# Patient Record
Sex: Male | Born: 1940
Health system: Southern US, Community
[De-identification: ages and names within clinical notes are randomized; demographics above are authoritative.]

## PROBLEM LIST (undated history)

## (undated) DIAGNOSIS — E669 Obesity, unspecified: Secondary | ICD-10-CM

## (undated) DIAGNOSIS — I4891 Unspecified atrial fibrillation: Secondary | ICD-10-CM

## (undated) DIAGNOSIS — G4730 Sleep apnea, unspecified: Secondary | ICD-10-CM

## (undated) DIAGNOSIS — M1990 Unspecified osteoarthritis, unspecified site: Secondary | ICD-10-CM

## (undated) DIAGNOSIS — I872 Venous insufficiency (chronic) (peripheral): Secondary | ICD-10-CM

## (undated) DIAGNOSIS — E785 Hyperlipidemia, unspecified: Secondary | ICD-10-CM

## (undated) DIAGNOSIS — N529 Male erectile dysfunction, unspecified: Secondary | ICD-10-CM

## (undated) DIAGNOSIS — D126 Benign neoplasm of colon, unspecified: Secondary | ICD-10-CM

## (undated) DIAGNOSIS — I499 Cardiac arrhythmia, unspecified: Secondary | ICD-10-CM

## (undated) DIAGNOSIS — E119 Type 2 diabetes mellitus without complications: Secondary | ICD-10-CM

## (undated) DIAGNOSIS — I10 Essential (primary) hypertension: Secondary | ICD-10-CM

## (undated) HISTORY — PX: VARICOSE VEIN SURGERY: SHX832

## (undated) HISTORY — DX: Sleep apnea, unspecified: G47.30

## (undated) HISTORY — DX: Unspecified atrial fibrillation: I48.91

## (undated) HISTORY — PX: HERNIA REPAIR: SHX51

## (undated) HISTORY — DX: Obesity, unspecified: E66.9

## (undated) HISTORY — DX: Hyperlipidemia, unspecified: E78.5

## (undated) HISTORY — DX: Venous insufficiency (chronic) (peripheral): I87.2

## (undated) HISTORY — DX: Male erectile dysfunction, unspecified: N52.9

## (undated) HISTORY — DX: Benign neoplasm of colon, unspecified: D12.6

## (undated) HISTORY — DX: Type 2 diabetes mellitus without complications: E11.9

---

## 1998-06-06 ENCOUNTER — Ambulatory Visit (HOSPITAL_COMMUNITY): Admission: AD | Admit: 1998-06-06 | Discharge: 1998-06-06 | Payer: Self-pay | Admitting: *Deleted

## 2003-11-24 ENCOUNTER — Encounter: Admission: RE | Admit: 2003-11-24 | Discharge: 2003-11-24 | Payer: Self-pay | Admitting: Internal Medicine

## 2009-01-02 ENCOUNTER — Encounter: Admission: RE | Admit: 2009-01-02 | Discharge: 2009-01-02 | Payer: Self-pay | Admitting: General Surgery

## 2009-01-03 ENCOUNTER — Encounter: Admission: RE | Admit: 2009-01-03 | Discharge: 2009-01-03 | Payer: Self-pay | Admitting: General Surgery

## 2009-02-07 ENCOUNTER — Ambulatory Visit: Payer: Self-pay | Admitting: Vascular Surgery

## 2009-05-09 ENCOUNTER — Ambulatory Visit: Payer: Self-pay | Admitting: Vascular Surgery

## 2009-07-03 ENCOUNTER — Ambulatory Visit: Payer: Self-pay | Admitting: Vascular Surgery

## 2009-07-10 ENCOUNTER — Ambulatory Visit: Payer: Self-pay | Admitting: Vascular Surgery

## 2009-07-18 ENCOUNTER — Ambulatory Visit: Payer: Self-pay | Admitting: Vascular Surgery

## 2010-03-11 ENCOUNTER — Encounter: Payer: Self-pay | Admitting: General Surgery

## 2010-07-03 NOTE — Assessment & Plan Note (Signed)
OFFICE VISIT   ZIMIR, KITTLESON  DOB:  01/04/1941                                       07/03/2009  IHKVQ#:[id number]   Patient underwent laser ablation of his right great saphenous vein from  the mid calf to the saphenofemoral junction plus greater than 20 stab  phlebectomies of large bulging varicosities in the calf area.  He has  had previous thrombophlebitis in the right mid calf.  He tolerated the  procedure well.  Will return in 1 week for venous duplex exam and for  laser ablation of his contralateral left leg.  He was on Coumadin for  atrial fibrillation, which was discontinued 4 days ago, and he will  remain off Coumadin until following his contralateral leg procedure 1  week from now and take 1 aspirin as well as the ibuprofen.     Quita Skye Hart [place], M.D.  Electronically Signed   JDL/MEDQ  D:  07/03/2009  T:  07/04/2009  Job:  [id number]

## 2010-07-03 NOTE — Assessment & Plan Note (Signed)
OFFICE VISIT   Patrick Lozano, Patrick Lozano  DOB:  1940-09-27                                       05/09/2009  [id number]#:[id number]   The patient returns today 3 months following his initial evaluation for  severe venous insufficiency of both legs.  He had a severe episode of  thrombophlebitis in the right leg with a large thrombus in the right  calf in a nest of varicosities having initially been evaluated by Dr.  Carolynne Edouard.  We evaluated him shortly thereafter in December 2010 and found  gross reflux throughout both great saphenous veins and painful  varicosities in both legs in addition to the area of resolving  thrombophlebitis.  He also has reflux in the deep system of both legs.  He has treated these with long-leg elastic pressure stockings as well as  elevation and ibuprofen over the last 3 months with no improvement in  his symptoms.  He continues to have bulging painful varicosities in both  legs and slowly resolving thrombophlebitis in the right calf.  He is on  chronic Coumadin for atrial fibrillation.   On exam today, the area of thrombophlebitis in the right calf is not  inflamed and only mildly tender but does involve an area over 3-4 cm in  diameter in the mid to distal calf.  The bulging varicosities proximal  to this extent in the anterior thigh and around the knee associated  great saphenous system.  He has similar varicosities in the left leg and  distal thigh and medial calf with bulging varicosities.   Today I read visualized his great saphenous veins bilaterally, both of  which are, large with diffuse reflux throughout.   I think he would be treated with the following procedures: (1) Laser  ablation of right great saphenous vein with multiple stab phlebectomies  be followed by (2)  laser ablation of left great saphenous vein with  multiple stab phlebectomies.  We will discontinue his Coumadin 4 days  prior to the procedure and resume it post procedure.   We will proceed  with precertification for this to be done in the near future.     Quita Skye Hart [place], M.D.  Electronically Signed   JDL/MEDQ  D:  05/09/2009  T:  05/10/2009  Job:  [id number]   cc:   Thora Lance, M.D.

## 2010-07-03 NOTE — Assessment & Plan Note (Signed)
OFFICE VISIT   Patrick Lozano, Patrick Lozano  DOB:  Jun 25, 1940                                       07/18/2009  EAVWU#:[id number]   The patient returns 1 week post laser ablation of his left great  saphenous vein with multiple stab phlebectomies for painful varicosities  associated with greater saphenous reflux.  He has had less discomfort  with this procedure than the previous one on the right leg.  He  continues to have some problems with the long-leg stockings causing  irritation in the thigh.  He has had no distal edema.  He resumed his  Coumadin therapy yesterday.   On physical exam today his blood pressure 126/77, heart rate of 79,  respirations 24.  The stab phlebectomy sites in the left leg and distal  thigh and calf are all healing nicely with no evidence of infection.  No  distal edema is noted.  He has 3+ dorsalis pedis pulse in the left leg.  There is mild tenderness along the course of the great saphenous vein as  one would expect.  Venous duplex today reveals no evidence of the DVT,  total closure of the left great saphenous vein from the knee to the  saphenofemoral junction.  He was reassured regarding these findings.  He  will wear his long-leg stocking for 1 more week and return to see [place] on  a p.r.n. basis.     Quita Skye Hart [place], M.D.  Electronically Signed   JDL/MEDQ  D:  07/18/2009  T:  07/19/2009  Job:  [id number]

## 2010-07-03 NOTE — Consult Note (Signed)
NEW PATIENT CONSULTATION   Patrick Lozano, Patrick Lozano  DOB:  1940/10/06                                       02/07/2009  [id number]#:[id number]   The patient is a 70 year old male patient referred for severe venous  insufficiency of both lower extremities with recent thrombophlebitis in  the right leg.  This patient has had varicose veins for 20 years which  have been gradually enlarging in size and becoming increasingly  symptomatic.  He describes aching, throbbing and burning discomfort as  the day progresses, right worse than left, as well as ankle and foot  swelling.  Six weeks ago he had an episode of acute thrombophlebitis in  a large nest of varicosities in the right calf.  He was referred to Dr.  Carolynne Edouard for possible cellulitis and eventually studies revealed severe  venous insufficiency of both legs and he was referred for further  treatment.  He has not worn elastic compression stockings nor treated  his legs with elevation or pain medicine.  He has no history of stasis  ulcers or bleeding but states that his symptoms have been becoming  increasingly worse and he has had severe pain associated with the  thrombophlebitis.  He is on chronic Coumadin for atrial fibrillation.   CHRONIC MEDICAL PROBLEMS:  1. Type 2 diabetes mellitus.  2. Atrial fibrillation on Coumadin therapy.  3. Negative for coronary artery disease, hypertension, hyperlipidemia,      COPD or stroke.   PAST SURGICAL HISTORY:  Includes a hernia repair in 1960.   FAMILY HISTORY:  Positive for coronary artery disease in his father,  diabetes in father and brother.  Negative for stroke.   SOCIAL HISTORY:  He is married, has 2 children, is retired.  He quit  smoking in 1987.  He does not use alcohol.   REVIEW OF SYSTEMS:  Positive for atrial fibrillation for 11 years on  Coumadin, thrombophlebitis as noted, diffuse joint pain particularly in  his knees and fingers, negative for chest pain, dyspnea on  exertion,  hemoptysis, chronic bronchitis, wheezing.  All other systems in review  of systems are negative.   PHYSICAL EXAM:  Blood pressure is 138/79, heart rate 67, respirations  18.  Generally he is a well-developed, well-nourished male who is in no  apparent distress, he is alert and oriented x3.  HEENT exam is  unremarkable and normal.  Neck is supple, 3+ carotid pulses.  No bruits.  Chest clear to auscultation.  Cardiovascular exam reveals an irregular  rhythm with no murmurs.  Abdomen is soft, nontender, no masses.  He does  have diastasis recti in the upper abdomen.  He has 3+ femoral and 2+  dorsalis pedis pulses bilaterally.  Skin exam reveals hyperpigmentation  and thickening of the skin bilaterally in the lower third of the leg  extending down to the foot.  No other skin rashes noted.  He has severe  venous insufficiency of both lower extremities with a large area of  resolving thrombophlebitis in the right medial calf measuring 4 x 5 cm  consistent with the thrombosed varix.  He has bulging varicosities in  the distal thigh and medial calf bilaterally with diffuse reticular and  spider veins involving the lower third of the leg extending down to the  medial malleolus and on to the foot with 1+ to 2+ edema.  Venous duplex exam today reveals gross reflux in both great saphenous  veins with resolving thrombus in the large nest of varicosities in the  right medial calf.  He also has reflux in the deep system bilaterally  but no DVT.   This gentleman has severe venous insufficiency of both legs with recent  and severe thrombophlebitis in the right leg.  We will treat him with  long leg elastic compression stockings, elevation and analgesics and he  will return in 3 months.  At that time we should treat him with  bilateral laser ablation and stab phlebectomies beginning with the right  side.  He will need to discontinue his Coumadin 4 days prior to this  procedure.  He will  return in 3 months.     Quita Skye Hart Rochester, M.D.  Electronically Signed   JDL/MEDQ  D:  02/07/2009  T:  02/08/2009  Job:  [id number]   cc:   Thora Lance, M.D.  Ollen Gross. Vernell Morgans, M.D.

## 2010-07-03 NOTE — Procedures (Signed)
DUPLEX DEEP VENOUS EXAM - LOWER EXTREMITY   INDICATION:  Left greater saphenous vein laser procedure with  phlebectomies.   HISTORY:  Edema:  Yes.  Trauma/Surgery:  Right and left greater saphenous vein laser procedure  with phlebectomies.  Pain:  Yes.  PE:  No.  Previous DVT:  No.  Anticoagulants:  No.  Other:   DUPLEX EXAM:                CFV   SFV   PopV  PTV    GSV                R  L  R  L  R  L  R   L  R      L  Thrombosis    o  o     o     o      o  Lasered closed Lasered closed  Spontaneous   +  +     +     +      +  Phasic        +  +     +     +      +  Augmentation  +  +     +     +      +  Compressible  +  +     +     +      +  Competent     +  +     +     +      +   Legend:  + - yes  o - no  p - partial  D - decreased   IMPRESSION:  1. The left leg is negative for deep venous thrombosis.  2. The left greater saphenous vein has been lasered closed.  Areas of      phlebectomy are partially closed.    _____________________________  Quita Skye Hart [place], M.D.   NT/MEDQ  D:  07/18/2009  T:  07/18/2009  Job:  [id number]

## 2010-07-03 NOTE — Procedures (Signed)
DUPLEX DEEP VENOUS EXAM - LOWER EXTREMITY   INDICATION:  Follow up post laser procedure 07/03/2009 in the right  greater saphenous vein.   HISTORY:  Edema:  Yes  Trauma/Surgery:  Right greater saphenous vein laser procedure done on  07/03/2009  Pain:  Yes  PE:  NA  Previous DVT:  NA  Anticoagulants:  No  Other:   DUPLEX EXAM:                CFV   SFV   PopV  PTV    GSV                R  L  R  L  R  L  R   L  R  L  Thrombosis    0  0  0     0     0      +  Spontaneous   +  +  +     +     +      0  Phasic        +  +  +     +     +      0  Augmentation  +  +  +     +     +      0  Compressible  +  +  +     +     +      0  Competent     +  +  +     +     +      0   Legend:  + - yes  o - no  p - partial  D - decreased   IMPRESSION:  1. The right leg is negative for deep venous thrombosis.  2. The right greater saphenous vein has been lasered closed.  Area of      phlebectomies are also closed off.  3. Area on right calf that appears to be large clot or mass measuring      4.4 x 3.2 cm.         _____________________________  Quita Skye. Hart [place], M.D.   NT/MEDQ  D:  07/11/2009  T:  07/11/2009  Job:  [id number]

## 2010-07-03 NOTE — Assessment & Plan Note (Signed)
OFFICE VISIT   OMARI, MCMANAWAY  DOB:  [birth year]                                       07/10/2009  [id number]#:[id number]   The patient returns for followup regarding his right laser ablation of  his right great saphenous vein and multiple stab phlebectomies which was  performed 1 week ago.  He has no significant tenderness in the thigh but  has had problems with the elastic compression stocking causing him some  difficulty.  He had no distal edema.  No pain in the stab phlebectomy  sites.  A venous duplex exam was performed which revealed no evidence of  deep venous thrombosis and total closure of the right great saphenous  vein from the knee to the saphenofemoral junction.  He was reassured  regarding these findings and will wear the stocking for one more week.  He also had a second procedure performed today which was laser ablation  of the left great saphenous vein with 10 to 20 stab phlebectomies in the  calf anteriorly and posteriorly and the distal thigh.  He tolerated this  procedure well and will return in 1 week for followup duplex exam of the  left leg.     Quita Skye Hart [place], M.D.  Electronically Signed   JDL/MEDQ  D:  07/10/2009  T:  07/11/2009  Job:  [id number]

## 2010-07-03 NOTE — Procedures (Signed)
LOWER EXTREMITY VENOUS REFLUX EXAM   INDICATION:  Bilateral varicose veins with thrombosed right calf  varicosity.   EXAM:  Using color-flow imaging and pulse Doppler spectral analysis, the  right and left common femoral, superficial femoral, popliteal, posterior  tibial, greater and lesser saphenous veins are evaluated.  There is  evidence suggesting deep venous insufficiency in the right and left  lower extremities.   The right and left saphenofemoral junctions are not competent with  reflux of > 500 milliseconds.  Right and left GSVs are not competent  with reflux of >500 milliseconds with the caliber as described below.   The right and left proximal short saphenous veins demonstrate  competency.   GSV Diameter (used if found to be incompetent only)                                            Right    Left  Proximal Greater Saphenous Vein           1.10 cm  1.17 cm  Proximal-to-mid-thigh                     cm       cm  Mid thigh                                 1.03 cm  0.96 cm  Mid-distal thigh                          cm       cm  Distal thigh                              1.03 cm  0.97 cm  Knee                                      0.98 cm  0.87 cm   IMPRESSION:  1. Right greater saphenous vein reflux with >500 milliseconds is      identified with the caliber ranging from 0.98 cm to 1.10 cm knee to      groin, the left from 0.87 cm to 1.17 cm.  2. The right and left greater saphenous veins are not aneurysmal.  3. The right and left greater saphenous veins are not tortuous.  4. The deep venous system is not competent with reflux of >500      milliseconds.  5. The right and left lesser saphenous veins are competent.  6. Dilatated varicose veins in bilateral calves with the right being      thrombosed.         ___________________________________________  Quita Skye Hart [place], M.D.   AS/MEDQ  D:  02/07/2009  T:  02/08/2009  Job:  [id number]

## 2013-01-26 ENCOUNTER — Other Ambulatory Visit: Payer: Self-pay | Admitting: Interventional Cardiology

## 2013-03-17 ENCOUNTER — Telehealth: Payer: Self-pay | Admitting: Interventional Cardiology

## 2013-03-17 NOTE — Telephone Encounter (Signed)
Walk In pt Form " Fax meds to [facility]" gave to Amy

## 2013-03-18 ENCOUNTER — Telehealth: Payer: Self-pay | Admitting: Cardiology

## 2013-03-18 MED ORDER — VERAPAMIL HCL ER 240 MG PO TBCR: 240.0000 mg | EXTENDED_RELEASE_TABLET | Freq: Every day | ORAL | Status: DC

## 2013-03-18 MED ORDER — METOPROLOL TARTRATE 50 MG PO TABS: ORAL_TABLET | ORAL | Status: DC

## 2013-03-18 NOTE — Telephone Encounter (Signed)
Refilled

## 2013-06-17 ENCOUNTER — Encounter: Payer: Self-pay | Admitting: Interventional Cardiology

## 2013-07-29 ENCOUNTER — Ambulatory Visit: Payer: Self-pay | Admitting: Interventional Cardiology

## 2013-08-10 ENCOUNTER — Encounter: Payer: Self-pay | Admitting: Cardiology

## 2013-08-10 DIAGNOSIS — I4891 Unspecified atrial fibrillation: Secondary | ICD-10-CM | POA: Insufficient documentation

## 2013-08-10 DIAGNOSIS — E785 Hyperlipidemia, unspecified: Secondary | ICD-10-CM

## 2013-08-10 DIAGNOSIS — E669 Obesity, unspecified: Secondary | ICD-10-CM | POA: Insufficient documentation

## 2013-08-10 DIAGNOSIS — G4730 Sleep apnea, unspecified: Secondary | ICD-10-CM | POA: Insufficient documentation

## 2013-08-13 ENCOUNTER — Other Ambulatory Visit: Payer: Self-pay

## 2013-08-13 MED ORDER — METOPROLOL TARTRATE 50 MG PO TABS: ORAL_TABLET | ORAL | Status: DC

## 2013-08-13 MED ORDER — VERAPAMIL HCL ER 240 MG PO TBCR: 240.0000 mg | EXTENDED_RELEASE_TABLET | Freq: Every day | ORAL | Status: DC

## 2013-08-16 ENCOUNTER — Encounter: Payer: Self-pay | Admitting: Interventional Cardiology

## 2013-08-16 ENCOUNTER — Ambulatory Visit (INDEPENDENT_AMBULATORY_CARE_PROVIDER_SITE_OTHER): Payer: Medicare Other | Admitting: Interventional Cardiology

## 2013-08-16 VITALS — BP 110/58 | HR 46 | Ht 73.5 in | Wt 279.0 lb

## 2013-08-16 DIAGNOSIS — G4730 Sleep apnea, unspecified: Secondary | ICD-10-CM

## 2013-08-16 DIAGNOSIS — I4891 Unspecified atrial fibrillation: Secondary | ICD-10-CM

## 2013-08-16 DIAGNOSIS — R5381 Other malaise: Secondary | ICD-10-CM

## 2013-08-16 DIAGNOSIS — E669 Obesity, unspecified: Secondary | ICD-10-CM

## 2013-08-16 DIAGNOSIS — R5383 Other fatigue: Secondary | ICD-10-CM

## 2013-08-16 DIAGNOSIS — E785 Hyperlipidemia, unspecified: Secondary | ICD-10-CM

## 2013-08-16 MED ORDER — ATORVASTATIN CALCIUM 20 MG PO TABS: 10.0000 mg | ORAL_TABLET | Freq: Every day | ORAL | Status: DC

## 2013-08-16 MED ORDER — METOPROLOL TARTRATE 25 MG PO TABS: ORAL_TABLET | ORAL | Status: DC

## 2013-08-16 NOTE — Patient Instructions (Signed)
Your physician has recommended you make the following change in your medication:   1. Decrease Metoprolol to 25 mg twice a day.  2. Decrease Atorvastatin to 10 mg daily.   3. Start Carlson's Fish Oil 2,000 mg twice a day.  Your physician recommends that you return for a FASTING lipid profile and hepatic on 11/16/13.   Your physician wants you to follow-up in: 1 year with Dr. Irish Lack. You will receive a reminder letter in the mail two months in advance. If you don't receive a letter, please call our office to schedule the follow-up appointment.

## 2013-08-16 NOTE — Progress Notes (Signed)
Patient ID: Patrick Lozano, male   DOB: [birth year], 73 y.o.   MRN: [id number]    [address] [place], [place]  [address] Phone: [phone] Fax:  [phone]  Date:  08/16/2013   ID:  Lesley, Galentine Mar 08, 1940, MRN [id number]  PCP:  Irven Shelling, MD      History of Present Illness: Patrick Lozano is a 73 y.o. male who has atrial fibrillation. He had a procedure for his varicose veins a few years ago. His legs feels somewhat better now. Mild ankle swelling. No bleeding problems. He is not walking consistently. Atrial Fibrillation F/U:  c/o Dizziness while getting up from sitting position quickly.  c/o Leg edema minimal.  Denies : Chest pain.  Orthopnea.  Palpitations.  Shortness of breath.  Syncope.  He is trying to increase his walking duration.    Wt Readings from Last 3 Encounters:  08/16/13 279 lb (126.554 kg)     Past Medical History  Diagnosis Date  . Atrial fibrillation     failed cardioversion and passed, rate control and anticoagulation   . Unspecified sleep apnea   . Obesity, unspecified   . Hyperlipidemia   . Insomnia   . Venous insufficiency     with lower extremity venous varicosities  . ED (erectile dysfunction)     does not desire treatment  . Adenomatous colon polyp 2007    [facility]   . Diabetes mellitus without complication   . Arrhythmia     Current Outpatient Prescriptions  Medication Sig Dispense Refill  . atorvastatin (LIPITOR) 20 MG tablet Take 20 mg by mouth daily.      Marland Kitchen glipiZIDE (GLUCOTROL) 10 MG tablet Take 10 mg by mouth 2 (two) times daily before a meal.      . metFORMIN (GLUCOPHAGE) 500 MG tablet Take 500 mg by mouth 2 (two) times daily with a meal.      . metoprolol (LOPRESSOR) 50 MG tablet Take 1 tablet by mouth  twice a day  180 tablet  0  . Multiple Vitamins-Minerals (MULTIVITAMIN PO) Take by mouth.      . verapamil (CALAN-SR) 240 MG CR tablet Take 1 tablet (240 mg total) by mouth at bedtime.  90  tablet  0  . warfarin (COUMADIN) 10 MG tablet Take 10 mg by mouth daily. Except 5 mg on wed as directed       No current facility-administered medications for this visit.    Allergies:   No Known Allergies  Social History:  The patient  reports that he has quit smoking. He does not have any smokeless tobacco history on file. He reports that he does not drink alcohol or use illicit drugs.   Family History:  The patient's family history includes Breast cancer in his mother; CAD in his brother, father, and mother; Diabetes in his mother; Heart failure in his brother; Prostate cancer in his father.   ROS:  Please see the history of present illness.  No nausea, vomiting.  No fevers, chills.  No focal weakness.  No dysuria.    All other systems reviewed and negative.   PHYSICAL EXAM: VS:  BP 110/58  Pulse 46  Ht 6' 1.5" (1.867 m)  Wt 279 lb (126.554 kg)  BMI 36.31 kg/m2 Well nourished, well developed, in no acute distress HEENT: normal Neck: no JVD, no carotid bruits Cardiac:  normal S1, S2; irregularly irregular Lungs:  clear to auscultation bilaterally, no wheezing, rhonchi or rales Abd:  soft, nontender, no hepatomegaly Ext: mild bilateral edema Skin: warm and dry Neuro:   no focal abnormalities noted  EKG:  AFib, slow ventricular response, RBBB  , left posterior fascicular block  ASSESSMENT AND PLAN:  Atrial fibrillation  Decrease Metoprolol Succinate Tablet Extended Release 24 Hour, 25 MG, 1 tablet, Twice a day Continue Verapamil HCl Capsule Extended Release 24 Hour, 240 MG, 1 capsule every morning on an empty stomach, Orally, Once a day IMAGING: EKG    Harward,Amy 07/30/2012 10:12:44 AM > VARANASI,JAY 07/30/2012 10:42:18 AM > AFib, RBBB, rate controlled   Notes: rate controlled. If he develops dizziness, would consider decreasing rate control meds. No CAD issues so will stop aspirin.    2. Unspecified sleep apnea  Notes: Continue to use CPAP.    3. Obesity  Notes: He will  work on weight loss. Lost about 4 lbs since last visit.  Diet is ok.     4. Fatigue/unsteadiness:  Will decrease metoprolol and see if this helps.  If HR still slow at next apt with Dr. Laurann Montana, or weakness spells return, would stop metoprolol completely.   5.  Lipids in 4/15: TG elevated 266.  HDL 32; LDL 32.  Decrease lipitor to 10 mg daily.  Start Fish oil 2 grams BID.  Preventive Medicine  Adult topics discussed:  Diet: healthy diet, low calorie, low fat.  Exercise: 5 days a week, at least 30 minutes of aerobic exercise.      Signed, Mina Marble, MD, [facility] 08/16/2013 3:16 PM

## 2013-09-08 ENCOUNTER — Telehealth: Payer: Self-pay | Admitting: Interventional Cardiology

## 2013-09-08 NOTE — Telephone Encounter (Signed)
New message    Patient calling C/O heart rate is slower 40-44 beat per minute.  While walking some sob.

## 2013-09-08 NOTE — Telephone Encounter (Signed)
SPOKE  WITH  PT   IN GREAT LENGTH  WAS   RECENTLY IN OFFICE  WITH HR  OF  46   METOPROLOL WAS   ADJUSTED TO   25   MG  TWICE  DAILY PER PT   THIS  HAD HELPED   FOR   A  WHILE  NOW  RATE IS EVEN LOWER   RUNNING  40-44. ALSO   NOTES  SOB  WITH MUCH ACTIVITY   AND  WEAKNESS  .PT INSTRUCTED   TO  HOLD   SECOND DOSE OF  METOPROLOL IF  HR  BELOW  50    WILL FORWARD  TO   DR  Lacie Draft AND   AMY FOR REVIEW. ALSO INFORMED PT  TO CALL  OFFICE  BACK IF   NO IMPROVEMENT  IN  S/S /CY

## 2013-09-09 NOTE — Telephone Encounter (Signed)
Pt notified. Pt will stop metoprolol completely. Pt will call back if symptoms persist.

## 2013-09-09 NOTE — Telephone Encounter (Signed)
Ok to stop metoprolol completely to sees if fatigue improves

## 2013-09-14 ENCOUNTER — Encounter: Payer: Self-pay | Admitting: Cardiology

## 2013-09-14 ENCOUNTER — Telehealth: Payer: Self-pay | Admitting: Cardiology

## 2013-09-14 DIAGNOSIS — R001 Bradycardia, unspecified: Secondary | ICD-10-CM

## 2013-09-14 DIAGNOSIS — R531 Weakness: Secondary | ICD-10-CM

## 2013-09-14 NOTE — Telephone Encounter (Signed)
Per Dr. Irish Lack check to see if pt has any sx with bradycardia. He may need holter.

## 2013-09-14 NOTE — Telephone Encounter (Signed)
Per Dr. Irish Lack order 48 hour holter. Lm for pt to return call.

## 2013-09-14 NOTE — Telephone Encounter (Addendum)
Pt notified. Msg sent to [facility] to schedule.

## 2013-09-14 NOTE — Telephone Encounter (Addendum)
Spoke with pt and he has been off metoprolol for since 09/10/13. Pt states his HR is still running in the low 40's. BP running 110-120/40-50's, with occasional 832'P systolic. Pt has fatigue and weakness that is intermittent with exertion and from sitting to standing. He has been taking it easy and not doing much. He feels that he may have some lightheadedness and exertional shortness of breath (usually with slight exertion).

## 2013-09-14 NOTE — Telephone Encounter (Signed)
Follow up      Returning Amy's call

## 2013-09-15 ENCOUNTER — Ambulatory Visit: Payer: Medicare Other | Admitting: Interventional Cardiology

## 2013-09-15 ENCOUNTER — Encounter: Payer: Self-pay | Admitting: *Deleted

## 2013-09-15 ENCOUNTER — Encounter (INDEPENDENT_AMBULATORY_CARE_PROVIDER_SITE_OTHER): Payer: Medicare Other

## 2013-09-15 DIAGNOSIS — R531 Weakness: Secondary | ICD-10-CM

## 2013-09-15 DIAGNOSIS — I498 Other specified cardiac arrhythmias: Secondary | ICD-10-CM

## 2013-09-15 DIAGNOSIS — R001 Bradycardia, unspecified: Secondary | ICD-10-CM

## 2013-09-15 DIAGNOSIS — I4891 Unspecified atrial fibrillation: Secondary | ICD-10-CM

## 2013-09-15 NOTE — Progress Notes (Signed)
Patient ID: Patrick Lozano, male   DOB: Dec 28, 1940, 73 y.o.   MRN: [id number] E-Cardio 48 hour holter monitor applied to patient.

## 2013-09-20 ENCOUNTER — Telehealth: Payer: Self-pay | Admitting: Interventional Cardiology

## 2013-09-20 ENCOUNTER — Encounter (HOSPITAL_COMMUNITY): Payer: Self-pay | Admitting: Emergency Medicine

## 2013-09-20 ENCOUNTER — Emergency Department (HOSPITAL_COMMUNITY): Payer: Medicare Other

## 2013-09-20 ENCOUNTER — Inpatient Hospital Stay (HOSPITAL_COMMUNITY)
Admission: EM | Admit: 2013-09-20 | Discharge: 2013-09-23 | DRG: 242 | Disposition: A | Discharge status: Home / Self Care | Payer: Medicare Other | Attending: Cardiology | Admitting: Cardiology

## 2013-09-20 DIAGNOSIS — Z8042 Family history of malignant neoplasm of prostate: Secondary | ICD-10-CM

## 2013-09-20 DIAGNOSIS — I498 Other specified cardiac arrhythmias: Secondary | ICD-10-CM

## 2013-09-20 DIAGNOSIS — I495 Sick sinus syndrome: Principal | ICD-10-CM | POA: Diagnosis present

## 2013-09-20 DIAGNOSIS — Z950 Presence of cardiac pacemaker: Secondary | ICD-10-CM

## 2013-09-20 DIAGNOSIS — Z8249 Family history of ischemic heart disease and other diseases of the circulatory system: Secondary | ICD-10-CM

## 2013-09-20 DIAGNOSIS — Z87891 Personal history of nicotine dependence: Secondary | ICD-10-CM

## 2013-09-20 DIAGNOSIS — R55 Syncope and collapse: Secondary | ICD-10-CM

## 2013-09-20 DIAGNOSIS — E669 Obesity, unspecified: Secondary | ICD-10-CM | POA: Diagnosis present

## 2013-09-20 DIAGNOSIS — I442 Atrioventricular block, complete: Secondary | ICD-10-CM | POA: Diagnosis present

## 2013-09-20 DIAGNOSIS — E119 Type 2 diabetes mellitus without complications: Secondary | ICD-10-CM | POA: Diagnosis present

## 2013-09-20 DIAGNOSIS — I4891 Unspecified atrial fibrillation: Secondary | ICD-10-CM | POA: Diagnosis present

## 2013-09-20 DIAGNOSIS — R0600 Dyspnea, unspecified: Secondary | ICD-10-CM

## 2013-09-20 DIAGNOSIS — G4733 Obstructive sleep apnea (adult) (pediatric): Secondary | ICD-10-CM | POA: Diagnosis present

## 2013-09-20 DIAGNOSIS — I4510 Unspecified right bundle-branch block: Secondary | ICD-10-CM | POA: Diagnosis present

## 2013-09-20 DIAGNOSIS — Z803 Family history of malignant neoplasm of breast: Secondary | ICD-10-CM

## 2013-09-20 DIAGNOSIS — Z7901 Long term (current) use of anticoagulants: Secondary | ICD-10-CM

## 2013-09-20 DIAGNOSIS — Z833 Family history of diabetes mellitus: Secondary | ICD-10-CM

## 2013-09-20 DIAGNOSIS — I10 Essential (primary) hypertension: Secondary | ICD-10-CM | POA: Diagnosis present

## 2013-09-20 DIAGNOSIS — R0609 Other forms of dyspnea: Secondary | ICD-10-CM

## 2013-09-20 DIAGNOSIS — R0989 Other specified symptoms and signs involving the circulatory and respiratory systems: Secondary | ICD-10-CM

## 2013-09-20 DIAGNOSIS — I872 Venous insufficiency (chronic) (peripheral): Secondary | ICD-10-CM

## 2013-09-20 DIAGNOSIS — I509 Heart failure, unspecified: Secondary | ICD-10-CM | POA: Diagnosis present

## 2013-09-20 DIAGNOSIS — I5031 Acute diastolic (congestive) heart failure: Secondary | ICD-10-CM | POA: Diagnosis present

## 2013-09-20 DIAGNOSIS — E785 Hyperlipidemia, unspecified: Secondary | ICD-10-CM | POA: Diagnosis present

## 2013-09-20 DIAGNOSIS — Z6837 Body mass index (BMI) 37.0-37.9, adult: Secondary | ICD-10-CM

## 2013-09-20 DIAGNOSIS — R001 Bradycardia, unspecified: Secondary | ICD-10-CM

## 2013-09-20 LAB — GLUCOSE, CAPILLARY
Glucose-Capillary: 84 mg/dL (ref 70–99)
Glucose-Capillary: 103 mg/dL — ABNORMAL HIGH (ref 70–99)

## 2013-09-20 LAB — BASIC METABOLIC PANEL
Anion gap: 15 (ref 5–15)
BUN: 17 mg/dL (ref 6–23)
CO2: 21 mEq/L (ref 19–32)
Calcium: 9.1 mg/dL (ref 8.4–10.5)
Chloride: 104 mEq/L (ref 96–112)
Creatinine, Ser: 0.9 mg/dL (ref 0.50–1.35)
GFR calc Af Amer: >90 mL/min (ref >90)
GFR calc non Af Amer: 82 mL/min — ABNORMAL LOW (ref >90)
Glucose, Bld: 132 mg/dL — ABNORMAL HIGH (ref 70–99)
Potassium: 4.6 mEq/L (ref 3.7–5.3)
Sodium: 140 mEq/L (ref 137–147)

## 2013-09-20 LAB — MAGNESIUM: Magnesium: 1.8 mg/dL (ref 1.5–2.5)

## 2013-09-20 LAB — CBC
HCT: 41.8 % (ref 39.0–52.0)
Hemoglobin: 14 g/dL (ref 13.0–17.0)
MCH: 29.7 pg (ref 26.0–34.0)
MCHC: 33.5 g/dL (ref 30.0–36.0)
MCV: 88.7 fL (ref 78.0–100.0)
Platelets: 197 10*3/uL (ref 150–400)
RBC: 4.71 MIL/uL (ref 4.22–5.81)
RDW: 15.2 % (ref 11.5–15.5)
WBC: 6.2 10*3/uL (ref 4.0–10.5)

## 2013-09-20 LAB — MRSA PCR SCREENING: MRSA by PCR: NEGATIVE

## 2013-09-20 LAB — PROTIME-INR
INR: 2.74 — ABNORMAL HIGH (ref 0.00–1.49)
INR: 2.48 — AB (ref 0.00–1.49)
PROTHROMBIN TIME: 26.8 s — AB (ref 11.6–15.2)
Prothrombin Time: 29 seconds — ABNORMAL HIGH (ref 11.6–15.2)

## 2013-09-20 LAB — PRO B NATRIURETIC PEPTIDE: Pro B Natriuretic peptide (BNP): 1251 pg/mL — ABNORMAL HIGH (ref 0–125)

## 2013-09-20 LAB — TSH: TSH: 3.91 u[IU]/mL (ref 0.350–4.500)

## 2013-09-20 LAB — I-STAT TROPONIN, ED: Troponin i, poc: 0 ng/mL (ref 0.00–0.08)

## 2013-09-20 LAB — TROPONIN I: Troponin I: <0.3 ng/mL (ref <0.30)

## 2013-09-20 MED ORDER — ATORVASTATIN CALCIUM 10 MG PO TABS
10.0000 mg | ORAL_TABLET | Freq: Every day | ORAL | Status: DC
  Administered 2013-09-20 – 2013-09-22 (×3): 10 mg via ORAL
  Filled 2013-09-20 (×4): qty 1

## 2013-09-20 MED ORDER — SODIUM CHLORIDE 0.9 % IV SOLN: 250.0000 mL | INTRAVENOUS | Status: DC | PRN

## 2013-09-20 MED ORDER — WARFARIN SODIUM 10 MG PO TABS
10.0000 mg | ORAL_TABLET | ORAL | Status: AC
  Administered 2013-09-20: 21:00:00 10 mg via ORAL
  Filled 2013-09-20: qty 1

## 2013-09-20 MED ORDER — SODIUM CHLORIDE 0.9 % IV SOLN
1.0000 g | Freq: Once | INTRAVENOUS | Status: DC
  Filled 2013-09-20: qty 10

## 2013-09-20 MED ORDER — ASPIRIN 300 MG RE SUPP
300.0000 mg | RECTAL | Status: AC
  Filled 2013-09-20: qty 1

## 2013-09-20 MED ORDER — NITROGLYCERIN 0.4 MG SL SUBL: 0.4000 mg | SUBLINGUAL_TABLET | SUBLINGUAL | Status: DC | PRN

## 2013-09-20 MED ORDER — ASPIRIN 81 MG PO CHEW
324.0000 mg | CHEWABLE_TABLET | ORAL | Status: AC
  Administered 2013-09-20: 21:00:00 324 mg via ORAL
  Filled 2013-09-20: qty 4

## 2013-09-20 MED ORDER — INSULIN ASPART 100 UNIT/ML ~~LOC~~ SOLN
0.0000 [IU] | Freq: Three times a day (TID) | SUBCUTANEOUS | Status: DC
  Administered 2013-09-21: 3 [IU] via SUBCUTANEOUS
  Administered 2013-09-22: 2 [IU] via SUBCUTANEOUS
  Administered 2013-09-22: 3 [IU] via SUBCUTANEOUS
  Administered 2013-09-22 – 2013-09-23 (×2): 2 [IU] via SUBCUTANEOUS

## 2013-09-20 MED ORDER — ACETAMINOPHEN 325 MG PO TABS: 650.0000 mg | ORAL_TABLET | ORAL | Status: DC | PRN

## 2013-09-20 MED ORDER — ASPIRIN EC 81 MG PO TBEC
81.0000 mg | DELAYED_RELEASE_TABLET | Freq: Every day | ORAL | Status: DC
  Administered 2013-09-21 – 2013-09-23 (×3): 81 mg via ORAL
  Filled 2013-09-20 (×3): qty 1

## 2013-09-20 MED ORDER — SODIUM CHLORIDE 0.9 % IJ SOLN
3.0000 mL | Freq: Two times a day (BID) | INTRAMUSCULAR | Status: DC
  Administered 2013-09-21 – 2013-09-22 (×3): 3 mL via INTRAVENOUS

## 2013-09-20 MED ORDER — OMEGA-3-ACID ETHYL ESTERS 1 G PO CAPS
2.0000 g | ORAL_CAPSULE | Freq: Two times a day (BID) | ORAL | Status: DC
Start: 2013-09-20 — End: 2013-09-23
  Administered 2013-09-20 – 2013-09-23 (×6): 2 g via ORAL
  Filled 2013-09-20 (×7): qty 2

## 2013-09-20 MED ORDER — ONDANSETRON HCL 4 MG/2ML IJ SOLN: 4.0000 mg | Freq: Four times a day (QID) | INTRAMUSCULAR | Status: DC | PRN

## 2013-09-20 MED ORDER — WARFARIN - PHARMACIST DOSING INPATIENT: Freq: Every day | Status: DC

## 2013-09-20 MED ORDER — FUROSEMIDE 10 MG/ML IJ SOLN
40.0000 mg | Freq: Two times a day (BID) | INTRAMUSCULAR | Status: DC
  Administered 2013-09-20 – 2013-09-22 (×5): 40 mg via INTRAVENOUS
  Filled 2013-09-20 (×8): qty 4

## 2013-09-20 MED ORDER — SODIUM CHLORIDE 0.9 % IJ SOLN: 3.0000 mL | INTRAMUSCULAR | Status: DC | PRN

## 2013-09-20 NOTE — Telephone Encounter (Signed)
Patient's holter monitor results printed and taken to Dr. Tamala Julian, [place] who advised monitor shows atrial fib with severe bradycardia; advised patient to stop Verapamil.  I spoke with Hassan Rowan, patient's daughter-in-law and advised her of the findings and that Dr. Tamala Julian recommends patient go to [facility] ED.  Hassan Rowan verbalized understanding and agreement to take patient directly to [facility] ED and thanked me for the plan.  Trish, Cardiology cardmaster notified.

## 2013-09-20 NOTE — Consult Note (Signed)
ANTICOAGULATION CONSULT NOTE - Initial Consult  Pharmacy Consult for Coumadin Indication: atrial fibrillation  No Known Allergies   Vital Signs: Temp: 98.3 F (36.8 C) (08/03 1250) BP: 143/51 mmHg (08/03 1800) Pulse Rate: 36 (08/03 1800)  Labs:  Recent Labs  09/20/13 1252  HGB 14.0  HCT 41.8  PLT 197  LABPROT 29.0*  INR 2.74*  CREATININE 0.90    The CrCl is unknown because both a height and weight (above a minimum accepted value) are required for this calculation.   Medical History: Past Medical History  Diagnosis Date  . Atrial fibrillation     failed cardioversion and passed, rate control and anticoagulation   . Unspecified sleep apnea   . Obesity, unspecified   . Hyperlipidemia   . Venous insufficiency     with lower extremity venous varicosities  . ED (erectile dysfunction)     does not desire treatment  . Adenomatous colon polyp   . Diabetes mellitus without complication    Assessment: 73yom on coumadin pta for afib, being admitted for severe bradycardia and fatigue over the last month. INR on admission is therapeutic at 2.74. Coumadin to continue.  Home dose: 10mg  daily except 5mg  on Wednesday - last dose 8/2  Goal of Therapy:  INR 2-3 Monitor platelets by anticoagulation protocol: Yes   Plan:  1) Coumadin 10mg  x 1 tonight per home regimen 2) Daily INR  Deboraha Sprang 09/20/2013,7:29 PM

## 2013-09-20 NOTE — H&P (Signed)
Patient ID: Patrick Lozano MRN: [id number], DOB/AGE: 1941-01-31   Admit date: 09/20/2013   Primary Physician: Irven Shelling, MD Primary Cardiologist: Dr. Irish Lack   Pt. Profile:   Patrick Lozano is a 73 y.o. male with a history of atrial fibrillation, OSA, HLD, venous insufficieny, DM and obesity who presents to [facility] today after being called by cardiology office and told to present to the ED for atrial fib with severe bradycardia on Holter monitor.  He had complained of worsening generalized weakness, fatigue, sob with exertion, and lightheadedness when standing for the past few weeks. He can do his normal daily activities without feeling completely wiped out and it has been getting progressively worse. He has not passed out. He was evaluated by his cardiologist, Dr. Irish Lack, two weeks ago who discontinued his BB. His heart rate did not improve and he was then placed on a Holter monitor. The patient's daughter-in-law called the cardiology office to report the patient being pale and "not looking right "and the Holter monitor results were reviewed. His heart rates have gone down to the 20s during the night and sustained in the 30s and 40s during the day. Dr. Tamala Julian, the DOD, advised him to discontinue his verapamil (however he had already taken this medication this morning) and go to the ED for admission. The patient also reports an episode of chest tightness last week while walking up a hill. He has no history of coronary artery disease. No associated diaphoresis, radiation, nausea, palpitations. He also notes 5-6 pound weight gain recently as well as shortness of breath, lower extremity swelling, and abdominal distention. However he denies orthopnea or PND. He wears a CPAP every night.    Problem List  Past Medical History  Diagnosis Date  . Atrial fibrillation     failed cardioversion and passed, rate control and anticoagulation   . Unspecified sleep apnea   . Obesity, unspecified    . Hyperlipidemia   . Venous insufficiency     with lower extremity venous varicosities  . ED (erectile dysfunction)     does not desire treatment  . Adenomatous colon polyp   . Diabetes mellitus without complication     History reviewed. No pertinent past surgical history.   Allergies  No Known Allergies   Home Medications  Prior to Admission medications   Medication Sig Start Date End Date Taking? Authorizing Provider  atorvastatin (LIPITOR) 20 MG tablet Take 0.5 tablets (10 mg total) by mouth daily. 08/16/13  Yes Jettie Booze, MD  glipiZIDE (GLUCOTROL) 10 MG tablet Take 10 mg by mouth 2 (two) times daily before a meal.   Yes Historical Provider, MD  metFORMIN (GLUCOPHAGE) 500 MG tablet Take 1,000 mg by mouth 2 (two) times daily with a meal.    Yes Historical Provider, MD  Multiple Vitamins-Minerals (MULTIVITAMIN PO) Take 1 tablet by mouth daily.    Yes Historical Provider, MD  Omega-3 Fatty Acids (FISH OIL) 1000 MG CAPS Take 2,000 mg by mouth 2 (two) times daily. CARLSON'S   Yes Historical Provider, MD  verapamil (CALAN-SR) 240 MG CR tablet Take 1 tablet (240 mg total) by mouth at bedtime. 08/13/13  Yes Jettie Booze, MD  warfarin (COUMADIN) 10 MG tablet Take 10 mg by mouth daily. Except on Wednesdays, take 5 mg (1/2 tablet) as directed   Yes Historical Provider, MD    Family History  Family History  Problem Relation Age of Onset  . CAD Mother   . Breast  cancer Mother   . Diabetes Mother   . CAD Father   . Prostate cancer Father   . Heart failure Brother   . CAD Brother    Family Status  Relation Status Death Age  . Mother Deceased   . Father Deceased   . Brother Deceased      Social History  History   Social History  . Marital Status: Married    Spouse Name: N/A    Number of Children: N/A  . Years of Education: N/A   Occupational History  . Not on file.   Social History Main Topics  . Smoking status: Former Research scientist (life sciences)  . Smokeless tobacco:  Not on file  . Alcohol Use: No  . Drug Use: No  . Sexual Activity: Not on file   Other Topics Concern  . Not on file   Social History Narrative  . No narrative on file     Review of Systems General:  No chills, fever, night sweats or + weight changes.  Cardiovascular:  No chest pain, +dyspnea on exertion, edema, orthopnea, palpitations, paroxysmal nocturnal dyspnea. Dermatological: No rash, lesions/masses Respiratory: No cough, dyspnea Urologic: No hematuria, dysuria Abdominal:   No nausea, vomiting, diarrhea, bright red blood per rectum, melena, or hematemesis Neurologic:  No visual changes, +wkns, changes in mental status. All other systems reviewed and are otherwise negative except as noted above.  Physical Exam  Blood pressure 147/48, pulse 39, temperature 98.3 F (36.8 C), resp. rate 20, SpO2 95.00%.  General: Pleasant, NAD Psych: Normal affect. Neuro: Alert and oriented X 3. Moves all extremities spontaneously. HEENT: Normal  Neck: Supple without bruits or positive JVD. Lungs:  Resp regular and unlabored. Crackles at bases Heart: bradycardic. no s3, s4, or murmurs. Abdomen: Soft, non-tender, non-distended, BS + x 4. Distended Extremities: No clubbing, cyanosis. DP/PT/Radials 2+ and equal bilaterally. 1+ pitting edema  Labs  No results found for this basename: CKTOTAL, CKMB, TROPONINI,  in the last 72 hours Lab Results  Component Value Date   WBC 6.2 09/20/2013   HGB 14.0 09/20/2013   HCT 41.8 09/20/2013   MCV 88.7 09/20/2013   PLT 197 09/20/2013     Recent Labs Lab 09/20/13 1252  NA 140  K 4.6  CL 104  CO2 21  BUN 17  CREATININE 0.90  CALCIUM 9.1  GLUCOSE 132*     Radiology/Studies  Dg Chest Portable 1 View  09/20/2013   CLINICAL DATA:  Bradycardia.  EXAM: PORTABLE CHEST - 1 VIEW  COMPARISON:  None available for comparison at time of study interpretation.  FINDINGS: Cardiac silhouette appears at least mildly enlarged, even with consideration to this low  inspiratory portable examination with crowded, centrally engorged vascular markings. No pleural effusions or focal consolidations. Elevated right hemidiaphragm. No pneumothorax.  Mild degenerative change of thoracic spine. Soft tissue planes are unremarkable.  IMPRESSION: At least mild cardiomegaly, pulmonary vascular congestion.    ECG  HR 49. Atrial fibrillation with slow ventricular response Right bundle branch block Left posterior fascicular block  ASSESSMENT AND PLAN  Patrick Lozano is a 73 y.o. male with a history of atrial fibrillation, OSA, HLD, venous insufficieny, DM and obesity who presents to [facility] today after being called by cardiology office and told to present to the ED for atrial fib with severe bradycardia on Holter monitor.   Atrial fibrillation with severe bradycardia- He has been having issues with bradycardia and fatigue/weakness over the last month and this BB was gradually decreased and  ultimately discontinued. He was continued on verapamil CD, which he was told to discontinue today however he did take 240mg  this AM  -- TSH normal at 3.9  -- Continue coumadin. INR therapeutic at 2.74  CHF- ECHO in 2000 with normal EF. BNP elevated at 1251 and pulmonary vascular congestion on chest x-ray. Signs and symptoms of CHF on physical exam -- Will need a 2-D echo -- He has no hx of CHF and never been on Lasix. Consider one dose of 40mg  IV Lasix now. -- Strict I/Os  HTN- pressures slightly elevated. Off all anti-hypertensives. Consider adding an ACE in the setting of DM. Will add lisinopril 10mg    OSA -- Continue CPAP  HLD- in 4/15: TG elevated 266. HDL 32; LDL 32.  lipitor to 10 mg daily and Fish oil 2 grams BID.  DM- SSI  Signed, Perry Mount, PA-C 09/20/2013, 3:36 PM  Pager [phone]   The patient was seen, examined and discussed with Lorretta Harp, PA-C and I agree with the above.    73 y.o. male with a history of atrial fibrillation, OSA, HLD, venous insufficieny,  DM and obesity who presents to [facility] today after being called by cardiology office and told to present to the ED for atrial fib with severe bradycardia on Holter monitor.   The patient is being followed by Dr. Irish Lack, last seen on 09/09/13 when his betablocker was discontinued. Holter monitor showed bradycardia down to upper 20', currently in 30'. He is symptomatic with severe dizziness and SOB. No syncope.  He has already taken his daily verapamil SR 240 mg this am. TSH is normal.  The patient also reports an episode of chest tightness last week while walking up a hill. He has no history of coronary artery disease.   We will admit him to stepdown, monitor for bradycardia as he is asymptomatic at rest. We will administer calcium gluconate in 100 ml piggy bag to decrease the effect of calcium channel blocker.   He is also fluid overloaded, with symptoms of CHF, we will order an echocardiogram for evaluation of systolic and diastolic function and start iv Lasix. Crea is normal 0.9.   Dorothy Spark 09/20/2013

## 2013-09-20 NOTE — Telephone Encounter (Signed)
New problem:    Pt's daughter inlaw , called pt's  heart rate is in the 40 for the pass 3 week.  Pt would like to be seen today.

## 2013-09-20 NOTE — ED Provider Notes (Signed)
CSN: [id number]     Arrival date & time 09/20/13  1233 History   First MD Initiated Contact with Patient 09/20/13 1303     Chief Complaint  Patient presents with  . Bradycardia     (Consider location/radiation/quality/duration/timing/severity/associated sxs/prior Treatment) The history is provided by the patient.  pt w hx afib presents w generalized weakness, fatigue, sob w exertion, and lightheadedness when standing for the past few weeks.  Had seen cardiologist w same, and had his beta blocker stopped 2 weeks ago.  Last week pt wore Holter monitor, and today was called by his cardiologist's office and told to come to ED. No syncope. Denies chest pain. No other recent change in meds.  Symptoms persistent/recurrent since onset. Worse w exertion and/or standing abruptly.     Past Medical History  Diagnosis Date  . Atrial fibrillation     failed cardioversion and passed, rate control and anticoagulation   . Unspecified sleep apnea   . Obesity, unspecified   . Hyperlipidemia   . Insomnia   . Venous insufficiency     with lower extremity venous varicosities  . ED (erectile dysfunction)     does not desire treatment  . Adenomatous colon polyp 2007    [facility]   . Diabetes mellitus without complication   . Arrhythmia    History reviewed. No pertinent past surgical history. Family History  Problem Relation Age of Onset  . CAD Mother   . Breast cancer Mother   . Diabetes Mother   . CAD Father   . Prostate cancer Father   . Heart failure Brother   . CAD Brother    History  Substance Use Topics  . Smoking status: Former Research scientist (life sciences)  . Smokeless tobacco: Not on file  . Alcohol Use: No    Review of Systems  Constitutional: Negative for fever.  HENT: Negative for sore throat.   Eyes: Negative for redness.  Respiratory: Positive for shortness of breath.   Cardiovascular: Positive for leg swelling. Negative for chest pain.  Gastrointestinal: Negative for vomiting, abdominal pain  and diarrhea.  Genitourinary: Negative for flank pain.  Musculoskeletal: Negative for back pain and neck pain.  Skin: Negative for rash.  Neurological: Positive for light-headedness. Negative for headaches.  Hematological: Does not bruise/bleed easily.  Psychiatric/Behavioral: Negative for confusion.      Allergies  Review of patient's allergies indicates no known allergies.  Home Medications   Prior to Admission medications   Medication Sig Start Date End Date Taking? Authorizing Provider  atorvastatin (LIPITOR) 20 MG tablet Take 0.5 tablets (10 mg total) by mouth daily. 08/16/13   Jettie Booze, MD  glipiZIDE (GLUCOTROL) 10 MG tablet Take 10 mg by mouth 2 (two) times daily before a meal.    Historical Provider, MD  metFORMIN (GLUCOPHAGE) 500 MG tablet Take 500 mg by mouth 2 (two) times daily with a meal.    Historical Provider, MD  Multiple Vitamins-Minerals (MULTIVITAMIN PO) Take by mouth.    Historical Provider, MD  Omega-3 Fatty Acids (FISH OIL) 1000 MG CAPS Take 2,000 mg by mouth 2 (two) times daily. CARLSON'S    Historical Provider, MD  verapamil (CALAN-SR) 240 MG CR tablet Take 1 tablet (240 mg total) by mouth at bedtime. 08/13/13   Jettie Booze, MD  warfarin (COUMADIN) 10 MG tablet Take 10 mg by mouth daily. Except 5 mg on wed as directed    Historical Provider, MD   BP 120/64  Pulse 101  Temp(Src) 98.3 F (  36.8 C)  Resp 21  SpO2 95% Physical Exam  Nursing note and vitals reviewed. Constitutional: He is oriented to person, place, and time. He appears well-developed and well-nourished.  Markedly bradycardic  HENT:  Mouth/Throat: Oropharynx is clear and moist.  Eyes: Conjunctivae are normal. No scleral icterus.  Neck: Neck supple. No tracheal deviation present.  Cardiovascular: Normal heart sounds and intact distal pulses.   bradcardic  Pulmonary/Chest: Effort normal. No accessory muscle usage. No respiratory distress.  Abdominal: Soft. He exhibits no  distension. There is no tenderness.  Musculoskeletal: Normal range of motion. He exhibits edema. He exhibits no tenderness.  Bilateral lower leg/ankle edema, symmetric.   Neurological: He is alert and oriented to person, place, and time.  Skin: Skin is warm and dry. He is not diaphoretic.  Psychiatric: He has a normal mood and affect.    ED Course  Procedures (including critical care time) Labs Review  Results for orders placed during the hospital encounter of 09/20/13  CBC      Result Value Ref Range   WBC 6.2  4.0 - 10.5 K/uL   RBC 4.71  4.22 - 5.81 MIL/uL   Hemoglobin 14.0  13.0 - 17.0 g/dL   HCT 41.8  39.0 - 52.0 %   MCV 88.7  78.0 - 100.0 fL   MCH 29.7  26.0 - 34.0 pg   MCHC 33.5  30.0 - 36.0 g/dL   RDW 15.2  11.5 - 15.5 %   Platelets 197  150 - 400 K/uL  BASIC METABOLIC PANEL      Result Value Ref Range   Sodium 140  137 - 147 mEq/L   Potassium 4.6  3.7 - 5.3 mEq/L   Chloride 104  96 - 112 mEq/L   CO2 21  19 - 32 mEq/L   Glucose, Bld 132 (*) 70 - 99 mg/dL   BUN 17  6 - 23 mg/dL   Creatinine, Ser 0.90  0.50 - 1.35 mg/dL   Calcium 9.1  8.4 - 10.5 mg/dL   GFR calc non Af Amer 82 (*) >90 mL/min   GFR calc Af Amer >90  >90 mL/min   Anion gap 15  5 - 15  PRO B NATRIURETIC PEPTIDE      Result Value Ref Range   Pro B Natriuretic peptide (BNP) 1251.0 (*) 0 - 125 pg/mL  PROTIME-INR      Result Value Ref Range   Prothrombin Time 29.0 (*) 11.6 - 15.2 seconds   INR 2.74 (*) 0.00 - 1.49  I-STAT TROPOININ, ED      Result Value Ref Range   Troponin i, poc 0.00  0.00 - 0.08 ng/mL   Comment 3            Dg Chest Portable 1 View  09/20/2013   CLINICAL DATA:  Bradycardia.  EXAM: PORTABLE CHEST - 1 VIEW  COMPARISON:  None available for comparison at time of study interpretation.  FINDINGS: Cardiac silhouette appears at least mildly enlarged, even with consideration to this low inspiratory portable examination with crowded, centrally engorged vascular markings. No pleural effusions  or focal consolidations. Elevated right hemidiaphragm. No pneumothorax.  Mild degenerative change of thoracic spine. Soft tissue planes are unremarkable.  IMPRESSION: At least mild cardiomegaly, pulmonary vascular congestion.   Electronically Signed   By: Elon Alas   On: 09/20/2013 13:47       Date: 09/20/2013  Rate: 49  Rhythm: atrial fibrillation  QRS Axis: normal  Intervals: normal  ST/T Wave abnormalities: nonspecific ST/T changes  Conduction Disutrbances:right bundle branch block and left anterior fascicular block  Narrative Interpretation:   Old EKG Reviewed: changes noted    MDM  Iv ns. Continuous pulse ox and monitor. o2 [place].  Labs.  Ecg.   cxr.  Reviewed nursing notes and prior charts for additional history.   Ext pacer pads applied, not activated as bp normal, no cp, no syncope.  Cardiology paged re admission.  CRITICAL CARE  RE severe bradycardia, atrial fibrillation, weakness, near syncope, dyspnea Performed by: Mirna Mires Total critical care time: 35 Critical care time was exclusive of separately billable procedures and treating other patients. Critical care was necessary to treat or prevent imminent or life-threatening deterioration. Critical care was time spent personally by me on the following activities: development of treatment plan with patient and/or surrogate as well as nursing, discussions with consultants, evaluation of patient's response to treatment, examination of patient, obtaining history from patient or surrogate, ordering and performing treatments and interventions, ordering and review of laboratory studies, ordering and review of radiographic studies, pulse oximetry and re-evaluation of patient's condition.    Mirna Mires, MD 09/20/13 [phone]

## 2013-09-20 NOTE — ED Notes (Signed)
Per pt sts that was taken off metoprolol a few weeks ago due to low HR. sts has been wearing a halter monitor and had it checked this am and HR has been in the 40s. Sent here by Dr. Irish Lack. sts some weakness and SOB upon exertion.

## 2013-09-20 NOTE — Telephone Encounter (Signed)
Spoke with patient's daughter-in-law, Hassan Rowan, who is a nurse who states patient is c/o fatigue and low heart rate.  Hassan Rowan states she has been going to the patient's home to check his heart rate and it has been 40-42 bpm; she has not checked patient's BP.  Hassan Rowan reports patient is extremely pale and is unable to walk very short distances without severe fatigue.  Hassan Rowan states patient c/o weight gain; states she does not see lower extremity edema and she is uncertain as to when/how patient has been weighing himself.  Hassan Rowan states patient wore the holter monitor as advised but patient's is symptomatic and she wants patient to be seen as soon as possible before he passes out and hurts himself or before heart rate drops any lower.  I advised Hassan Rowan that I will discuss with Dr. Tamala Julian, DOD, and will call her back.  Hassan Rowan verbalized understanding and agreement.

## 2013-09-21 ENCOUNTER — Encounter (HOSPITAL_COMMUNITY): Payer: Self-pay | Admitting: *Deleted

## 2013-09-21 DIAGNOSIS — Z8042 Family history of malignant neoplasm of prostate: Secondary | ICD-10-CM | POA: Diagnosis not present

## 2013-09-21 DIAGNOSIS — Z833 Family history of diabetes mellitus: Secondary | ICD-10-CM | POA: Diagnosis not present

## 2013-09-21 DIAGNOSIS — I5031 Acute diastolic (congestive) heart failure: Secondary | ICD-10-CM | POA: Diagnosis present

## 2013-09-21 DIAGNOSIS — Z87891 Personal history of nicotine dependence: Secondary | ICD-10-CM | POA: Diagnosis not present

## 2013-09-21 DIAGNOSIS — I442 Atrioventricular block, complete: Secondary | ICD-10-CM | POA: Diagnosis present

## 2013-09-21 DIAGNOSIS — I495 Sick sinus syndrome: Secondary | ICD-10-CM | POA: Diagnosis present

## 2013-09-21 DIAGNOSIS — Z803 Family history of malignant neoplasm of breast: Secondary | ICD-10-CM | POA: Diagnosis not present

## 2013-09-21 DIAGNOSIS — E119 Type 2 diabetes mellitus without complications: Secondary | ICD-10-CM

## 2013-09-21 DIAGNOSIS — I509 Heart failure, unspecified: Secondary | ICD-10-CM | POA: Diagnosis present

## 2013-09-21 DIAGNOSIS — Z6837 Body mass index (BMI) 37.0-37.9, adult: Secondary | ICD-10-CM | POA: Diagnosis not present

## 2013-09-21 DIAGNOSIS — I4891 Unspecified atrial fibrillation: Secondary | ICD-10-CM | POA: Diagnosis present

## 2013-09-21 DIAGNOSIS — Z8249 Family history of ischemic heart disease and other diseases of the circulatory system: Secondary | ICD-10-CM | POA: Diagnosis not present

## 2013-09-21 DIAGNOSIS — I10 Essential (primary) hypertension: Secondary | ICD-10-CM | POA: Diagnosis present

## 2013-09-21 DIAGNOSIS — Z950 Presence of cardiac pacemaker: Secondary | ICD-10-CM | POA: Diagnosis not present

## 2013-09-21 DIAGNOSIS — E669 Obesity, unspecified: Secondary | ICD-10-CM | POA: Diagnosis present

## 2013-09-21 DIAGNOSIS — E785 Hyperlipidemia, unspecified: Secondary | ICD-10-CM | POA: Diagnosis present

## 2013-09-21 DIAGNOSIS — Z7901 Long term (current) use of anticoagulants: Secondary | ICD-10-CM | POA: Diagnosis not present

## 2013-09-21 DIAGNOSIS — I4510 Unspecified right bundle-branch block: Secondary | ICD-10-CM | POA: Diagnosis present

## 2013-09-21 DIAGNOSIS — G4733 Obstructive sleep apnea (adult) (pediatric): Secondary | ICD-10-CM | POA: Diagnosis present

## 2013-09-21 DIAGNOSIS — I498 Other specified cardiac arrhythmias: Secondary | ICD-10-CM | POA: Diagnosis present

## 2013-09-21 LAB — COMPREHENSIVE METABOLIC PANEL
ALBUMIN: 3.5 g/dL (ref 3.5–5.2)
ALT: 24 U/L (ref 0–53)
AST: 19 U/L (ref 0–37)
Alkaline Phosphatase: 74 U/L (ref 39–117)
Anion gap: 11 (ref 5–15)
BILIRUBIN TOTAL: 0.9 mg/dL (ref 0.3–1.2)
BUN: 19 mg/dL (ref 6–23)
CO2: 26 mEq/L (ref 19–32)
CREATININE: 1.03 mg/dL (ref 0.50–1.35)
Calcium: 9.1 mg/dL (ref 8.4–10.5)
Chloride: 102 mEq/L (ref 96–112)
GFR calc Af Amer: 81 mL/min — ABNORMAL LOW (ref >90)
GFR calc non Af Amer: 69 mL/min — ABNORMAL LOW (ref >90)
Glucose, Bld: 94 mg/dL (ref 70–99)
POTASSIUM: 4.4 meq/L (ref 3.7–5.3)
Sodium: 139 mEq/L (ref 137–147)
TOTAL PROTEIN: 6.5 g/dL (ref 6.0–8.3)

## 2013-09-21 LAB — TROPONIN I: Troponin I: <0.3 ng/mL (ref <0.30)

## 2013-09-21 LAB — PROTIME-INR
INR: 2.56 — ABNORMAL HIGH (ref 0.00–1.49)
PROTHROMBIN TIME: 27.5 s — AB (ref 11.6–15.2)

## 2013-09-21 LAB — GLUCOSE, CAPILLARY
Glucose-Capillary: 106 mg/dL — ABNORMAL HIGH (ref 70–99)
Glucose-Capillary: 113 mg/dL — ABNORMAL HIGH (ref 70–99)
Glucose-Capillary: 156 mg/dL — ABNORMAL HIGH (ref 70–99)
Glucose-Capillary: 158 mg/dL — ABNORMAL HIGH (ref 70–99)

## 2013-09-21 LAB — CALCIUM, IONIZED: Calcium, Ion: 1.26 mmol/L (ref 1.13–1.30)

## 2013-09-21 LAB — CBC
HCT: 39.3 % (ref 39.0–52.0)
Hemoglobin: 13.1 g/dL (ref 13.0–17.0)
MCH: 29.8 pg (ref 26.0–34.0)
MCHC: 33.3 g/dL (ref 30.0–36.0)
MCV: 89.3 fL (ref 78.0–100.0)
Platelets: 193 10*3/uL (ref 150–400)
RBC: 4.4 MIL/uL (ref 4.22–5.81)
RDW: 15.3 % (ref 11.5–15.5)
WBC: 6.2 10*3/uL (ref 4.0–10.5)

## 2013-09-21 LAB — LIPID PANEL
Cholesterol: 93 mg/dL (ref 0–200)
HDL: 34 mg/dL — ABNORMAL LOW (ref >39)
LDL Cholesterol: 43 mg/dL (ref 0–99)
Total CHOL/HDL Ratio: 2.7 RATIO
Triglycerides: 78 mg/dL (ref <150)
VLDL: 16 mg/dL (ref 0–40)

## 2013-09-21 LAB — HEMOGLOBIN A1C
HEMOGLOBIN A1C: 6.8 % — AB (ref <5.7)
Mean Plasma Glucose: 148 mg/dL — ABNORMAL HIGH (ref <117)

## 2013-09-21 LAB — MAGNESIUM: Magnesium: 1.8 mg/dL (ref 1.5–2.5)

## 2013-09-21 MED ORDER — WARFARIN SODIUM 10 MG PO TABS
10.0000 mg | ORAL_TABLET | Freq: Once | ORAL | Status: DC
  Filled 2013-09-21: qty 1

## 2013-09-21 MED ORDER — LISINOPRIL 10 MG PO TABS
10.0000 mg | ORAL_TABLET | Freq: Every day | ORAL | Status: DC
  Administered 2013-09-21 – 2013-09-23 (×3): 10 mg via ORAL
  Filled 2013-09-21 (×3): qty 1

## 2013-09-21 NOTE — Care Management Note (Addendum)
  Page 1 of 1   09/21/2013     1:31:26 PM CARE MANAGEMENT NOTE 09/21/2013  Patient:  Patrick Lozano, Patrick Lozano   Account Number:  [id number]  Date Initiated:  09/21/2013  Documentation initiated by:  Mariann Laster  Subjective/Objective Assessment:   Bradycardia     Action/Plan:   CM to follow for disposition needs   Anticipated DC Date:  09/22/2013   Anticipated DC Plan:  HOME/SELF CARE         Choice offered to / List presented to:             Status of service:  Completed, signed off Medicare Important Message given?   (If response is "NO", the following Medicare IM given date fields will be blank) Date Medicare IM given:   Medicare IM given by:   Date Additional Medicare IM given:   Additional Medicare IM given by:    Discharge Disposition:  HOME/SELF CARE  Per UR Regulation:    If discussed at Long Length of Stay Meetings, dates discussed:    Comments:  Evadene Wardrip RN, BSN, MSHL, CCM  Nurse - Case Manager, (Unit [phone]  09/21/2013 Observation / echo pending

## 2013-09-21 NOTE — Progress Notes (Addendum)
ANTICOAGULATION CONSULT NOTE - Follow Up Consult  Pharmacy Consult for coumadin Indication: atrial fibrillation  No Known Allergies  Patient Measurements: Height: 6\' 1"  (185.4 cm) Weight: 286 lb 9.6 oz (130 kg) IBW/kg (Calculated) : 79.9 Heparin Dosing Weight:   Vital Signs: Temp: 97.6 F (36.4 C) (08/04 0745) Temp src: Oral (08/04 0745) BP: 133/64 mmHg (08/04 0745) Pulse Rate: 25 (08/04 0745)  Labs:  Recent Labs  09/20/13 1252 09/20/13 2025 09/21/13 0126  HGB 14.0  --  13.1  HCT 41.8  --  39.3  PLT 197  --  193  LABPROT 29.0* 26.8* 27.5*  INR 2.74* 2.48* 2.56*  CREATININE 0.90  --  1.03  TROPONINI  --  <0.30 <0.30    Estimated Creatinine Clearance: 88.9 ml/min (by C-G formula based on Cr of 1.03).   Medications:  Scheduled:  . aspirin EC  81 mg Oral Daily  . atorvastatin  10 mg Oral q1800  . calcium gluconate  1 g Intravenous Once  . furosemide  40 mg Intravenous BID  . insulin aspart  0-15 Units Subcutaneous TID WC  . omega-3 acid ethyl esters  2 g Oral BID  . sodium chloride  3 mL Intravenous Q12H  . Warfarin - Pharmacist Dosing Inpatient   Does not apply q1800   Infusions:    Assessment: 73 yo male with afib is currently on therapeutic coumadin.  INR today is 2.56. Goal of Therapy:  INR 2-3 Monitor platelets by anticoagulation protocol: Yes   Plan:  1) No coumadin tonight per MD's request. May have packmaker 2) INR in am  Heaven Wandell, Tsz-Yin 09/21/2013,8:14 AM

## 2013-09-21 NOTE — Progress Notes (Signed)
Patient Name: Patrick Lozano Date of Encounter: 09/21/2013     Active Problems:   Bradycardia    SUBJECTIVE  Feeling much better after IV Lasix. Breathing much better. No CP. Weakness and SOB improved somewhat as well. Has been up to the bathroom without lightheadedness.   CURRENT MEDS . aspirin EC  81 mg Oral Daily  . atorvastatin  10 mg Oral q1800  . calcium gluconate  1 g Intravenous Once  . furosemide  40 mg Intravenous BID  . insulin aspart  0-15 Units Subcutaneous TID WC  . omega-3 acid ethyl esters  2 g Oral BID  . sodium chloride  3 mL Intravenous Q12H  . Warfarin - Pharmacist Dosing Inpatient   Does not apply q1800    OBJECTIVE  Filed Vitals:   09/20/13 2350 09/21/13 0020 09/21/13 0613 09/21/13 0745  BP: 154/36  144/56 133/64  Pulse: 32  32 25  Temp: 98 F (36.7 C)  97.7 F (36.5 C) 97.6 F (36.4 C)  TempSrc: Oral  Oral Oral  Resp: 17  20 18   Height:      Weight:  286 lb 9.6 oz (130 kg)    SpO2: 92%  96% 95%    Intake/Output Summary (Last 24 hours) at 09/21/13 0812 Last data filed at 09/21/13 0241  Gross per 24 hour  Intake    120 ml  Output   2500 ml  Net  -2380 ml   Filed Weights   09/20/13 1925 09/21/13 0020  Weight: 286 lb 9.6 oz (130 kg) 286 lb 9.6 oz (130 kg)    PHYSICAL EXAM  General: Pleasant, NAD  Psych: Normal affect.  Neuro: Alert and oriented X 3. Moves all extremities spontaneously.  HEENT: Normal  Neck: Supple without bruits or positive JVD.  Lungs: Resp regular and unlabored. Crackles at bases  Heart: bradycardic. no s3, s4, or murmurs.  Abdomen: Soft, non-tender, non-distended, BS + x 4. Distended  Extremities: No clubbing, cyanosis. DP/PT/Radials 2+ and equal bilaterally. 1+ pitting edema   Accessory Clinical Findings  CBC  Recent Labs  09/20/13 1252 09/21/13 0126  WBC 6.2 6.2  HGB 14.0 13.1  HCT 41.8 39.3  MCV 88.7 89.3  PLT 197 938   Basic Metabolic Panel  Recent Labs  09/20/13 1252 09/20/13 2025  09/21/13 0126  NA 140  --  139  K 4.6  --  4.4  CL 104  --  102  CO2 21  --  26  GLUCOSE 132*  --  94  BUN 17  --  19  CREATININE 0.90  --  1.03  CALCIUM 9.1  --  9.1  MG  --  1.8 1.8   Liver Function Tests  Recent Labs  09/21/13 0126  AST 19  ALT 24  ALKPHOS 74  BILITOT 0.9  PROT 6.5  ALBUMIN 3.5    Cardiac Enzymes  Recent Labs  09/20/13 2025 09/21/13 0126  TROPONINI <0.30 <0.30    Hemoglobin A1C  Recent Labs  09/20/13 2025  HGBA1C 6.8*   Fasting Lipid Panel  Recent Labs  09/21/13 0126  CHOL 93  HDL 34*  LDLCALC 43  TRIG 78  CHOLHDL 2.7   Thyroid Function Tests  Recent Labs  09/20/13 1322  TSH 3.910    TELE  Atrial fibrillation with slow VR. Into 20s overnight. Low 40s currently. Freq PVCs  Radiology/Studies  Dg Chest Portable 1 View  09/20/2013   CLINICAL DATA:  Bradycardia.  EXAM: PORTABLE CHEST -  1 VIEW  COMPARISON:  None available for comparison at time of study interpretation.  FINDINGS: Cardiac silhouette appears at least mildly enlarged, even with consideration to this low inspiratory portable examination with crowded, centrally engorged vascular markings. No pleural effusions or focal consolidations. Elevated right hemidiaphragm. No pneumothorax.  Mild degenerative change of thoracic spine. Soft tissue planes are unremarkable.  IMPRESSION: At least mild cardiomegaly, pulmonary vascular congestion.      ASSESSMENT AND PLAN  Patrick Lozano is a 73 y.o. male with a history of atrial fibrillation, OSA, HLD, venous insufficieny, DM and obesity who presents to [facility] today after being called by cardiology office and told to present to the ED for atrial fib with severe bradycardia on Holter monitor.   Atrial fibrillation with severe bradycardia- He has been having issues with bradycardia and fatigue/weakness over the last month and this BB was gradually decreased and ultimately discontinued. He was continued on verapamil CD, which he was told  to discontinue on the day of admission; however he did take 240mg  yesterday AM  -- TSH normal at 3.9  -- Continue coumadin. INR therapeutic at 2.56  -- Planned to give calcium gluconate in 100 ml piggy bag to decrease the effect of CCB he took yesterday AM. HR has not improved. Consider EP consult.   CHF- ECHO in 2000 with normal EF. BNP elevated at 1251 and pulmonary vascular congestion on chest x-ray. Signs and symptoms of CHF on physical exam  -- 2-D echo today -- He has no hx of CHF and never been on Lasix. Started on 40mg  IV Lasix   -- Strict I/Os. Neg 2.3L and weight stable. He is feeling much better with diuresis.   HTN- Off all anti-hypertensives (home BB and CCB stopped due to bradycardia).  Lisinopril 10mg  added to regimen in the setting of DM.   OSA - Continue CPAP   HLD- in 4/15: TG elevated 266. HDL 32; LDL 32. lipitor10 mg daily and Fish oil 2 grams BID.   DM- SSI. Hg A1c 6.8   Tyrell Antonio PA-C  Pager [phone]   Patient seen and examined. Agree with assessment and plan. Pt with probable SSS; He had been on Toprol and Verapamil.  Toprol was stopped 2 weeks ago; last dose of verapamil was yesterday. HR now 30 - 40's. Will hold coumadin; INR 2.56.  Echo today. EP evaluation. Suspect he will need pacemaker. He had 1 episode of mild chest tightness walking up hill on driveway last week. Consider nuclear study to further evaluate in future.   Troy Sine, MD, [facility] 09/21/2013 9:13 AM

## 2013-09-21 NOTE — Progress Notes (Signed)
UR completed 

## 2013-09-22 ENCOUNTER — Encounter (HOSPITAL_COMMUNITY)
Admission: EM | Disposition: A | Discharge status: Home / Self Care | Payer: Self-pay | Source: Home / Self Care | Attending: Cardiology

## 2013-09-22 DIAGNOSIS — I369 Nonrheumatic tricuspid valve disorder, unspecified: Secondary | ICD-10-CM

## 2013-09-22 DIAGNOSIS — I442 Atrioventricular block, complete: Secondary | ICD-10-CM

## 2013-09-22 HISTORY — PX: PACEMAKER INSERTION: SHX728

## 2013-09-22 HISTORY — PX: PERMANENT PACEMAKER INSERTION: SHX5480

## 2013-09-22 LAB — GLUCOSE, CAPILLARY
Glucose-Capillary: 167 mg/dL — ABNORMAL HIGH (ref 70–99)
Glucose-Capillary: 146 mg/dL — ABNORMAL HIGH (ref 70–99)
Glucose-Capillary: 138 mg/dL — ABNORMAL HIGH (ref 70–99)
Glucose-Capillary: 183 mg/dL — ABNORMAL HIGH (ref 70–99)

## 2013-09-22 LAB — PROTIME-INR
INR: 2.82 — ABNORMAL HIGH (ref 0.00–1.49)
Prothrombin Time: 29.7 seconds — ABNORMAL HIGH (ref 11.6–15.2)

## 2013-09-22 SURGERY — PERMANENT PACEMAKER INSERTION
Anesthesia: LOCAL

## 2013-09-22 MED ORDER — CEFAZOLIN SODIUM-DEXTROSE 2-3 GM-% IV SOLR
2.0000 g | Freq: Four times a day (QID) | INTRAVENOUS | Status: AC
  Administered 2013-09-23 (×3): 2 g via INTRAVENOUS
  Filled 2013-09-22 (×3): qty 50

## 2013-09-22 MED ORDER — ACETAMINOPHEN 325 MG PO TABS
325.0000 mg | ORAL_TABLET | ORAL | Status: DC | PRN
  Administered 2013-09-22 – 2013-09-23 (×2): 650 mg via ORAL
  Filled 2013-09-22 (×2): qty 2

## 2013-09-22 MED ORDER — DEXTROSE 5 % IV SOLN
3.0000 g | INTRAVENOUS | Status: DC
  Filled 2013-09-22: qty 3000

## 2013-09-22 MED ORDER — SODIUM CHLORIDE 0.9 % IR SOLN
80.0000 mg | Status: DC
  Filled 2013-09-22: qty 2

## 2013-09-22 MED ORDER — SODIUM CHLORIDE 0.9 % IV SOLN: INTRAVENOUS | Status: DC

## 2013-09-22 MED ORDER — MIDAZOLAM HCL 5 MG/5ML IJ SOLN
INTRAMUSCULAR | Status: AC
  Filled 2013-09-22: qty 5

## 2013-09-22 MED ORDER — CHLORHEXIDINE GLUCONATE 4 % EX LIQD: 60.0000 mL | Freq: Once | CUTANEOUS | Status: DC

## 2013-09-22 MED ORDER — ONDANSETRON HCL 4 MG/2ML IJ SOLN: 4.0000 mg | Freq: Four times a day (QID) | INTRAMUSCULAR | Status: DC | PRN

## 2013-09-22 MED ORDER — LIDOCAINE HCL (PF) 1 % IJ SOLN
INTRAMUSCULAR | Status: AC
  Filled 2013-09-22: qty 60

## 2013-09-22 MED ORDER — CEFAZOLIN SODIUM-DEXTROSE 2-3 GM-% IV SOLR
2.0000 g | Freq: Four times a day (QID) | INTRAVENOUS | Status: DC
  Filled 2013-09-22 (×3): qty 50

## 2013-09-22 NOTE — Progress Notes (Signed)
  Echocardiogram 2D Echocardiogram has been performed.  Patrick Lozano 09/22/2013, 9:57 AM

## 2013-09-22 NOTE — Consult Note (Addendum)
ELECTROPHYSIOLOGY CONSULT NOTE    Patient ID: Patrick Lozano MRN: [id number], DOB/AGE: Oct 11, 1940 73 y.o.  Admit date: 09/20/2013 Date of Consult: 09-22-13  Primary Physician: Irven Shelling, MD Primary Cardiologist: Irish Lack  Reason for Consultation: bradycardia  HPI:  Patrick Lozano is a 73 y.o. male with a past medical history significant for permanent atrial fibrillation, obesity, sleep apnea (on CPAP), and diabetes.  He has done well with rate control on Toprol and Verapamil for several years.  In May, he began developing episodes of weakness that were intermittent and short in duration.  He was evaluated by Dr Irish Lack in June who weaned him off Toprol due to bradycardia. He then wore a 48 hour monitor which demonstrated significant bradycardia with rates in the 20's.  He was advised to come to [facility] for further evaluation.  His Verapamil has been held with last dose Monday morning.  On admission, he was fluid overloaded and has been diuresed with symptomatic improvement in shortness of breath.   He has had one episode of chest tightness last week while walking up an incline that was relieved with rest.  He has not had any recent fevers, chills, nausea, palpitations, or frank syncope.  ROS is otherwise negative.   EP has been asked to evaluate for treatment options.   Past Medical History  Diagnosis Date  . Atrial fibrillation     failed cardioversion and passed, rate control and anticoagulation   . Unspecified sleep apnea   . Obesity, unspecified   . Hyperlipidemia   . Venous insufficiency     with lower extremity venous varicosities  . ED (erectile dysfunction)     does not desire treatment  . Adenomatous colon polyp   . Diabetes mellitus without complication      Surgical History: History reviewed. No pertinent past surgical history.   Prescriptions prior to admission  Medication Sig Dispense Refill  . atorvastatin (LIPITOR) 20 MG tablet Take 0.5 tablets (10 mg  total) by mouth daily.      Marland Kitchen glipiZIDE (GLUCOTROL) 10 MG tablet Take 10 mg by mouth 2 (two) times daily before a meal.      . metFORMIN (GLUCOPHAGE) 500 MG tablet Take 1,000 mg by mouth 2 (two) times daily with a meal.       . Multiple Vitamins-Minerals (MULTIVITAMIN PO) Take 1 tablet by mouth daily.       . Omega-3 Fatty Acids (FISH OIL) 1000 MG CAPS Take 2,000 mg by mouth 2 (two) times daily. CARLSON'S      . verapamil (CALAN-SR) 240 MG CR tablet Take 1 tablet (240 mg total) by mouth at bedtime.  90 tablet  0  . warfarin (COUMADIN) 10 MG tablet Take 10 mg by mouth daily. Except on Wednesdays, take 5 mg (1/2 tablet) as directed        Inpatient Medications:  . aspirin EC  81 mg Oral Daily  . atorvastatin  10 mg Oral q1800  . calcium gluconate  1 g Intravenous Once  . furosemide  40 mg Intravenous BID  . insulin aspart  0-15 Units Subcutaneous TID WC  . lisinopril  10 mg Oral Daily  . omega-3 acid ethyl esters  2 g Oral BID  . sodium chloride  3 mL Intravenous Q12H  . Warfarin - Pharmacist Dosing Inpatient   Does not apply q1800    Allergies: No Known Allergies  History   Social History  . Marital Status: Married    Spouse Name: N/A  Number of Children: N/A  . Years of Education: N/A   Occupational History  . Not on file.   Social History Main Topics  . Smoking status: Former Research scientist (life sciences)  . Smokeless tobacco: Not on file  . Alcohol Use: No  . Drug Use: No  . Sexual Activity: Not on file   Other Topics Concern  . Not on file   Social History Narrative  . No narrative on file     Family History  Problem Relation Age of Onset  . CAD Mother   . Breast cancer Mother   . Diabetes Mother   . CAD Father   . Prostate cancer Father   . Heart failure Brother   . CAD Brother     BP 153/53  Pulse 37  Temp(Src) 98.3 F (36.8 C) (Oral)  Resp 20  Ht 6\' 1"  (1.854 m)  Wt 285 lb 0.9 oz (129.3 kg)  BMI 37.62 kg/m2  SpO2 95%  Physical Exam: stable appearing 73 yo man,  NAD HEENT: Unremarkable,[place], AT Neck:  7 JVD, no thyromegally Back:  No CVA tenderness Lungs:  Clear with no wheezes, rales, or rhonchi HEART:  Regular brady rhythm, no murmurs, no rubs, no clicks Abd:  soft, positive bowel sounds, no organomegally, no rebound, no guarding Ext:  2 plus pulses, no edema, no cyanosis, no clubbing Skin:  No rashes no nodules Neuro:  CN II through XII intact, motor grossly intact   Labs:   Lab Results  Component Value Date   WBC 6.2 09/21/2013   HGB 13.1 09/21/2013   HCT 39.3 09/21/2013   MCV 89.3 09/21/2013   PLT 193 09/21/2013    Recent Labs Lab 09/21/13 0126  NA 139  K 4.4  CL 102  CO2 26  BUN 19  CREATININE 1.03  CALCIUM 9.1  PROT 6.5  BILITOT 0.9  ALKPHOS 74  ALT 24  AST 19  GLUCOSE 94    Radiology/Studies: Dg Chest Portable 1 View 09/20/2013   CLINICAL DATA:  Bradycardia.  EXAM: PORTABLE CHEST - 1 VIEW  COMPARISON:  None available for comparison at time of study interpretation.  FINDINGS: Cardiac silhouette appears at least mildly enlarged, even with consideration to this low inspiratory portable examination with crowded, centrally engorged vascular markings. No pleural effusions or focal consolidations. Elevated right hemidiaphragm. No pneumothorax.  Mild degenerative change of thoracic spine. Soft tissue planes are unremarkable.  IMPRESSION: At least mild cardiomegaly, pulmonary vascular congestion.   Electronically Signed   By: Elon Alas   On: 09/20/2013 13:47   EKG: atrial fibrillation, rate 35, RBBB QRS 160  TELEMETRY: atrial fibrillation with slow ventricular response, ventricular rates 20-30's  A/P 1. Chronic atrial fib 2. Symptomatic bradycardia due to complete heart block and a very slow ventricular escape off of AV nodal blocking drugs. 3. Acute diastolic heart failure secondary to #1 and 2.  Rec: PPM insertion is indicated. As his atrial fib is chronic, will see about our leadless PM although with his INR being elevated, a  standard single chamber PPM is likely his best option.   Mikle Bosworth.D.

## 2013-09-22 NOTE — Progress Notes (Signed)
ANTICOAGULATION CONSULT NOTE - Follow Up Consult  Pharmacy Consult for coumadin Indication: atrial fibrillation  No Known Allergies  Patient Measurements: Height: 6\' 1"  (185.4 cm) Weight: 285 lb 0.9 oz (129.3 kg) IBW/kg (Calculated) : 79.9 Heparin Dosing Weight:   Vital Signs: Temp: 98.3 F (36.8 C) (08/05 0804) Temp src: Oral (08/05 0804) BP: 162/49 mmHg (08/05 0804) Pulse Rate: 32 (08/05 0804)  Labs:  Recent Labs  09/20/13 1252 09/20/13 2025 09/21/13 0126 09/21/13 0715 09/22/13 0315  HGB 14.0  --  13.1  --   --   HCT 41.8  --  39.3  --   --   PLT 197  --  193  --   --   LABPROT 29.0* 26.8* 27.5*  --  29.7*  INR 2.74* 2.48* 2.56*  --  2.82*  CREATININE 0.90  --  1.03  --   --   TROPONINI  --  <0.30 <0.30 <0.30  --     Estimated Creatinine Clearance: 88.7 ml/min (by C-G formula based on Cr of 1.03).   Medications:  Scheduled:  . aspirin EC  81 mg Oral Daily  . atorvastatin  10 mg Oral q1800  . calcium gluconate  1 g Intravenous Once  .  ceFAZolin (ANCEF) IV  3 g Intravenous On Call  . chlorhexidine  60 mL Topical Once  . furosemide  40 mg Intravenous BID  . gentamicin irrigation  80 mg Irrigation On Call  . insulin aspart  0-15 Units Subcutaneous TID WC  . lisinopril  10 mg Oral Daily  . omega-3 acid ethyl esters  2 g Oral BID  . sodium chloride  3 mL Intravenous Q12H  . Warfarin - Pharmacist Dosing Inpatient   Does not apply q1800   Infusions:  . sodium chloride      Assessment: 73 yo male with afib is currently on therapeutic coumadin. INR today is 2.82.  Will have PPM insertion today.  Goal of Therapy:  INR 2-3 Monitor platelets by anticoagulation protocol: Yes   Plan:  1) No coumadin tonight per medical team's order. 2) f/u INR in am and plan on coumadin  Shalice Woodring, Tsz-Yin 09/22/2013,12:50 PM

## 2013-09-22 NOTE — Clinical Documentation Improvement (Signed)
PLEASE SPECIFY TYPE & ACUITY CHF: Possible Clinical Conditions? Chronic Systolic Congestive Heart Failure Chronic Diastolic Congestive Heart Failure Chronic Systolic & Diastolic Congestive Heart Failure Acute Systolic Congestive Heart Failure Acute Diastolic Congestive Heart Failure Acute Systolic & Diastolic Congestive Heart Failure Acute on Chronic Systolic Congestive Heart Failure Acute on Chronic Diastolic Congestive Heart Failure Acute on Chronic Systolic & Diastolic Congestive Heart Failure Other Condition Cannot Clinically Determine  Supporting Information:(As per notes) "CHF- ECHO in 2000 with normal EF. BNP elevated at 1251 and pulmonary vascular congestion on chest x-ray. Signs and symptoms of CHF on physical exam" Diagnostics:CXR ON 09-20-13 IMPRESSION: At least mild cardiomegaly, pulmonary vascular congestion.   Thank You, Alessandra Grout, RN, BSN, CCDS,Clinical Documentation Specialist:  [phone]  [phone]=[employer]- Health Information Management

## 2013-09-22 NOTE — CV Procedure (Signed)
SURGEON:  Cristopher Peru, MD     PREPROCEDURE DIAGNOSIS:  Symptomatic Bradycardia due to complete heart block in the setting of chronic atrial fibrillation    POSTPROCEDURE DIAGNOSIS:  Same as preprocedure diagnosis     PROCEDURES:   1. Pacemaker implantation.     INTRODUCTION: Patrick Lozano is a 73 y.o. male  with a history of bradycardia who presents today for pacemaker implantation.  The patient reports intermittent episodes of dizziness over the past few months.  No reversible causes have been identified.  The patient therefore presents today for pacemaker implantation.     DESCRIPTION OF PROCEDURE:  Informed written consent was obtained, and   the patient was brought to the electrophysiology lab in a fasting state.  The patient required no sedation for the procedure today.  The patients left chest was prepped and draped in the usual sterile fashion by the EP lab staff. The skin overlying the left deltopectoral region was infiltrated with lidocaine for local analgesia.  A 4-cm incision was made over the left deltopectoral region.  A left subcutaneous pacemaker pocket was fashioned using a combination of sharp and blunt dissection. Electrocautery was required to assure hemostasis.     RV Lead Placement: The left axillary vein was punctured.  Through the left axillary vein, a [employer](serial number [id number]) right ventricular lead was advanced with fluoroscopic visualization into the right ventricular apical septal position.  The  R-waves measured  16  mV with an impedance of  853 ohms and a threshold of  0.7 V at 0.5 msec.  Both leads were secured to the pectoralis fascia using #2-0 silk over the suture sleeves.   Device Placement:  The leads were then connected to a  [employer]  (serial number [id number] ) single chamber  pacemaker.  The pocket was irrigated with copious gentamicin solution.  The pacemaker was then placed into the pocket.  The pocket was then closed in 2 layers  with 2.0 Vicryl suture for the subcutaneous and subcuticular layers.  Steri-Strips and a sterile dressing were then applied.  There were no early apparent complications.     CONCLUSIONS:   1. Successful implantation of a  Chemical engineer  single  chamber pacemaker for symptomatic bradycardia due to complete heart block with underlying atrial fibrillation  2. No early apparent complications.           Cristopher Peru, MD 09/22/2013 6:33 PM

## 2013-09-23 ENCOUNTER — Encounter (HOSPITAL_COMMUNITY): Payer: Self-pay | Admitting: *Deleted

## 2013-09-23 ENCOUNTER — Inpatient Hospital Stay (HOSPITAL_COMMUNITY): Payer: Medicare Other

## 2013-09-23 LAB — GLUCOSE, CAPILLARY: Glucose-Capillary: 143 mg/dL — ABNORMAL HIGH (ref 70–99)

## 2013-09-23 LAB — PROTIME-INR
INR: 2.16 — ABNORMAL HIGH (ref 0.00–1.49)
PROTHROMBIN TIME: 24.1 s — AB (ref 11.6–15.2)

## 2013-09-23 MED ORDER — YOU HAVE A PACEMAKER BOOK
Freq: Once | Status: AC
  Administered 2013-09-23: 08:00:00
  Filled 2013-09-23: qty 1

## 2013-09-23 MED ORDER — LISINOPRIL 10 MG PO TABS: 10.0000 mg | ORAL_TABLET | Freq: Every day | ORAL | Status: DC

## 2013-09-23 MED ORDER — WARFARIN SODIUM 10 MG PO TABS: 10.0000 mg | ORAL_TABLET | Freq: Every day | ORAL | Status: DC

## 2013-09-23 NOTE — Progress Notes (Signed)
ANTICOAGULATION CONSULT NOTE - Follow Up Consult  Pharmacy Consult for coumadin Indication: atrial fibrillation  No Known Allergies  Patient Measurements: Height: 6\' 1"  (185.4 cm) Weight: 283 lb 11.7 oz (128.7 kg) IBW/kg (Calculated) : 79.9 Heparin Dosing Weight:   Vital Signs: Temp: 97.3 F (36.3 C) (08/06 0426) Temp src: Oral (08/06 0426) BP: 115/64 mmHg (08/06 0426) Pulse Rate: 60 (08/06 0426)  Labs:  Recent Labs  09/20/13 1252 09/20/13 2025 09/21/13 0126 09/21/13 0715 09/22/13 0315 09/23/13 0417  HGB 14.0  --  13.1  --   --   --   HCT 41.8  --  39.3  --   --   --   PLT 197  --  193  --   --   --   LABPROT 29.0* 26.8* 27.5*  --  29.7* 24.1*  INR 2.74* 2.48* 2.56*  --  2.82* 2.16*  CREATININE 0.90  --  1.03  --   --   --   TROPONINI  --  <0.30 <0.30 <0.30  --   --     Estimated Creatinine Clearance: 88.5 ml/min (by C-G formula based on Cr of 1.03).   Medications:  Scheduled:  . aspirin EC  81 mg Oral Daily  . atorvastatin  10 mg Oral q1800  . calcium gluconate  1 g Intravenous Once  .  ceFAZolin (ANCEF) IV  2 g Intravenous 4 times per day  . furosemide  40 mg Intravenous BID  . insulin aspart  0-15 Units Subcutaneous TID WC  . lisinopril  10 mg Oral Daily  . omega-3 acid ethyl esters  2 g Oral BID  . sodium chloride  3 mL Intravenous Q12H  . Warfarin - Pharmacist Dosing Inpatient   Does not apply q1800  . you have a pacemaker book   Does not apply Once   Infusions:    Assessment: 73 yo male with afib is currently on therapeutic coumadin.  INR is 2.16.  Patient is s/p PPM insertion.  Noted patient being discharged home today.  Goal of Therapy:  INR 2-3 Monitor platelets by anticoagulation protocol: Yes   Plan:  1) Continue home dose of coumadin after discharge.   Celesta Funderburk, Tsz-Yin 09/23/2013,9:12 AM

## 2013-09-23 NOTE — Discharge Summary (Signed)
ELECTROPHYSIOLOGY PROCEDURE DISCHARGE SUMMARY    Patient ID: Patrick Lozano,  MRN: [id number], DOB/AGE: 08/21/1940 73 y.o.  Admit date: 09/20/2013 Discharge date: 09/23/2013  Primary Care Physician: Irven Shelling, MD Primary Cardiologist: Irish Lack Electrophysiologist: Lovena Le  Primary Discharge Diagnosis:  Symptomatic bradycardia secondary to complete heart block, status post pacemaker implantation this admission  Secondary Discharge Diagnosis:  1.  Permanent atrial fibrillation 2.  Obesity 3.  Sleep apnea - on CPAP 4.  Diabetes 5. Acute diastolic heart failure due to marked bradycardia due to complete heart block  No Known Allergies   Procedures This Admission:  1.  Echocardiogram on 09-22-2013 demonstrated EF 60-65%, mild to moderate TR, LA 58 2.  Implantation of a single chamber pacemaker on 09-22-2013 by Dr Lovena Le.  The patient received a BSX single chamber pacemaker with RV lead.  See op note for full details. There were no early apparent complications.  3.  CXR on 09-23-2013 demonstrated no PTX.  Brief HPI/Hospital Course:  Patrick Lozano is a 73 y.o. male with a past medical history significant for permanent atrial fibrillation, obesity, sleep apnea (on CPAP), and diabetes. He has done well with rate control on Toprol and Verapamil for several years. In May, he began developing episodes of weakness that were intermittent and short in duration. He was evaluated by Dr Irish Lack in June who weaned him off Toprol due to bradycardia. He then wore a 48 hour monitor which demonstrated significant bradycardia with rates in the 20's. He was advised to come to [facility] for further evaluation. He was sob. His Verapamil has been held with last dose Monday morning. On admission, he was fluid overloaded and has been diuresed with symptomatic improvement in shortness of breath.  He was evaluated by Dr Lovena Le who recommended pacemaker implantation due to persistent symptomatic bradycardia.   Risks, benefits, and alternatives were explained to the patient who wished to proceed. The patient underwent implantation of a BSX single chamber pacemaker with details as outlined above.   He was monitored on telemetry overnight which demonstrated atrial fibrillation with ventricular pacing.  Left chest was without hematoma or ecchymosis.  The device was interrogated and found to be functioning normally.  CXR was obtained and demonstrated no pneumothorax status post device implantation.  Wound care, arm mobility, and restrictions were reviewed with the patient.  Dr Lovena Le examined the patient and considered them stable for discharge to home.    Discharge Vitals: Blood pressure 115/64, pulse 60, temperature 97.3 F (36.3 C), temperature source Oral, resp. rate 20, height 6\' 1"  (1.854 m), weight 283 lb 11.7 oz (128.7 kg), SpO2 97.00%.  Labs:   Lab Results  Component Value Date   WBC 6.2 09/21/2013   HGB 13.1 09/21/2013   HCT 39.3 09/21/2013   MCV 89.3 09/21/2013   PLT 193 09/21/2013    Recent Labs Lab 09/21/13 0126  NA 139  K 4.4  CL 102  CO2 26  BUN 19  CREATININE 1.03  CALCIUM 9.1  PROT 6.5  BILITOT 0.9  ALKPHOS 74  ALT 24  AST 19  GLUCOSE 94     Discharge Medications:    Medication List    ASK your doctor about these medications       atorvastatin 20 MG tablet  Commonly known as:  LIPITOR  Take 0.5 tablets (10 mg total) by mouth daily.     Fish Oil 1000 MG Caps  Take 2,000 mg by mouth 2 (two) times daily. CARLSON'S  glipiZIDE 10 MG tablet  Commonly known as:  GLUCOTROL  Take 10 mg by mouth 2 (two) times daily before a meal.     metFORMIN 500 MG tablet  Commonly known as:  GLUCOPHAGE  Take 1,000 mg by mouth 2 (two) times daily with a meal.     MULTIVITAMIN PO  Take 1 tablet by mouth daily.     verapamil 240 MG CR tablet  Commonly known as:  CALAN-SR  Take 1 tablet (240 mg total) by mouth at bedtime.     warfarin 10 MG tablet  Commonly known as:  COUMADIN    Take 10 mg by mouth daily. Except on Wednesdays, take 5 mg (1/2 tablet) as directed        Disposition:     Duration of Discharge Encounter: less than 30 minutes including physician time.  Signed, Mikle Bosworth.D.

## 2013-09-23 NOTE — Discharge Instructions (Signed)
° °  Supplemental Discharge Instructions for  Pacemaker/Defibrillator Patients  Activity No heavy lifting or vigorous activity with your left/right arm for 6 to 8 weeks.  Do not raise your left/right arm above your head for one week.  Gradually raise your affected arm as drawn below.                       08/09                   08/10                    08/11                    08/12            NO DRIVING for  one week    ; you may begin driving on     14/48     . WOUND CARE   Keep the wound area clean and dry.  Do not get this area wet for one week. No showers for one week; you may shower on      08/12        .   The tape/steri-strips on your wound will fall off; do not pull them off.  No bandage is needed on the site.  DO  NOT apply any creams, oils, or ointments to the wound area.   If you notice any drainage or discharge from the wound, any swelling or bruising at the site, or you develop a fever > 101? F after you are discharged home, call the office at once.  Special Instructions   You are still able to use cellular telephones; use the ear opposite the side where you have your pacemaker/defibrillator.  Avoid carrying your cellular phone near your device.   When traveling through airports, show security personnel your identification card to avoid being screened in the metal detectors.  Ask the security personnel to use the hand wand.   Avoid arc welding equipment, MRI testing (magnetic resonance imaging), TENS units (transcutaneous nerve stimulators).  Call the office for questions about other devices.   Avoid electrical appliances that are in poor condition or are not properly grounded.   Microwave ovens are safe to be near or to operate.  Additional information for defibrillator patients should your device go off:   If your device goes off ONCE and you feel fine afterward, notify the device clinic nurses.   If your device goes off ONCE and you do not feel well afterward, call 911.   If your device goes off TWICE, call 911.   If your device goes off THREE times in one day, call 911.  DO NOT DRIVE YOURSELF OR A FAMILY MEMBER WITH A DEFIBRILLATOR TO THE HOSPITAL--CALL 911.

## 2013-10-06 ENCOUNTER — Ambulatory Visit (INDEPENDENT_AMBULATORY_CARE_PROVIDER_SITE_OTHER): Payer: Medicare Other | Admitting: *Deleted

## 2013-10-06 DIAGNOSIS — I498 Other specified cardiac arrhythmias: Secondary | ICD-10-CM

## 2013-10-06 DIAGNOSIS — R001 Bradycardia, unspecified: Secondary | ICD-10-CM

## 2013-10-06 LAB — MDC_IDC_ENUM_SESS_TYPE_INCLINIC
Battery Remaining Longevity: 66 mo
Brady Statistic RV Percent Paced: 100 %
Date Time Interrogation Session: 20150819040000
Implantable Pulse Generator Serial Number: 119862
Lead Channel Pacing Threshold Amplitude: 1.2 V
Lead Channel Pacing Threshold Pulse Width: 4 ms
Lead Channel Setting Sensing Sensitivity: 2.5 mV
MDC IDC MSMT LEADCHNL RV IMPEDANCE VALUE: 865 Ohm
MDC IDC SET LEADCHNL RV PACING AMPLITUDE: 1.7 V
MDC IDC SET LEADCHNL RV PACING PULSEWIDTH: 0.4 ms
MDC IDC SET ZONE DETECTION INTERVAL: 375 ms

## 2013-10-06 NOTE — Progress Notes (Signed)
Wound check appointment. Steri-strips removed. Wound without redness or edema. Incision edges approximated, wound well healed. Normal device function. Thresholds, sensing, and impedances consistent with implant measurements. Device programmed at 3.5V/auto capture programmed on for extra safety margin until 3 month visit. Histogram distribution blunted.  Minute ventilation response factor reprogrammed 8->10.   No high ventricular rates noted. Patient educated about wound care, arm mobility, lifting restrictions. ROV in 3 months with implanting physician.

## 2013-10-22 ENCOUNTER — Encounter: Payer: Self-pay | Admitting: Internal Medicine

## 2013-11-10 ENCOUNTER — Ambulatory Visit (INDEPENDENT_AMBULATORY_CARE_PROVIDER_SITE_OTHER): Payer: Medicare Other | Admitting: Interventional Cardiology

## 2013-11-10 ENCOUNTER — Encounter: Payer: Self-pay | Admitting: Interventional Cardiology

## 2013-11-10 VITALS — BP 110/54 | HR 58 | Ht 73.0 in | Wt 263.0 lb

## 2013-11-10 DIAGNOSIS — I4891 Unspecified atrial fibrillation: Secondary | ICD-10-CM

## 2013-11-10 DIAGNOSIS — I482 Chronic atrial fibrillation, unspecified: Secondary | ICD-10-CM

## 2013-11-10 DIAGNOSIS — E785 Hyperlipidemia, unspecified: Secondary | ICD-10-CM

## 2013-11-10 DIAGNOSIS — E669 Obesity, unspecified: Secondary | ICD-10-CM

## 2013-11-10 DIAGNOSIS — E119 Type 2 diabetes mellitus without complications: Secondary | ICD-10-CM

## 2013-11-10 NOTE — Progress Notes (Signed)
Patient ID: [name], male   DOB: Nov 22, 1940, 73 y.o.   MRN: [id number] Patient ID: [name], male   DOB: 1940-04-14, 73 y.o.   MRN: [id number]    [address], [place], [place]  [address] Phone: [phone] Fax:  [phone]  Date:  11/10/2013   ID:  Patrick Lozano, Patrick Lozano [birth year], MRN [id number]  PCP:  Irven Shelling, MD      History of Present Illness: Patrick Lozano is a 73 y.o. male who has atrial fibrillation. He had a procedure for his varicose veins a few years ago. His legs feels somewhat better now. Pacer a few weeks ago in 2015.  Feeling much better.  No bleeding problems.  Atrial Fibrillation F/U:  Resolved Dizziness while getting up from sitting position quickly.  c/o Leg edema minimal.  Denies : Chest pain.  Orthopnea.  Palpitations.  Shortness of breath.  Syncope.  He is trying to increase his walking duration.  He has lost weight.  He has lost 10 lbs with diet control.  He wants to get a treadmill.  Wt Readings from Last 3 Encounters:  11/10/13 263 lb (119.296 kg)  09/23/13 283 lb 11.7 oz (128.7 kg)  09/23/13 283 lb 11.7 oz (128.7 kg)     Past Medical History  Diagnosis Date  . Atrial fibrillation     failed cardioversion and passed, rate control and anticoagulation   . Unspecified sleep apnea   . Obesity, unspecified   . Hyperlipidemia   . Venous insufficiency     with lower extremity venous varicosities  . ED (erectile dysfunction)     does not desire treatment  . Adenomatous colon polyp   . Diabetes mellitus without complication     Current Outpatient Prescriptions  Medication Sig Dispense Refill  . atorvastatin (LIPITOR) 20 MG tablet Take 0.5 tablets (10 mg total) by mouth daily.      Marland Kitchen glipiZIDE (GLUCOTROL) 10 MG tablet Take 10 mg by mouth 2 (two) times daily before a meal.      . lisinopril (PRINIVIL,ZESTRIL) 10 MG tablet Take 1 tablet (10 mg total) by mouth daily.  30 tablet  11  . metFORMIN (GLUCOPHAGE) 500 MG  tablet Take 1,000 mg by mouth 2 (two) times daily with a meal.       . Multiple Vitamins-Minerals (MULTIVITAMIN PO) Take 1 tablet by mouth daily.       . Omega-3 Fatty Acids (FISH OIL) 1000 MG CAPS Take 2,000 mg by mouth 2 (two) times daily. CARLSON'S      . verapamil (CALAN-SR) 240 MG CR tablet Take 1 tablet (240 mg total) by mouth at bedtime.  90 tablet  0  . warfarin (COUMADIN) 10 MG tablet Take 1 tablet (10 mg total) by mouth daily. Except on Wednesdays, take 5 mg (1/2 tablet) as directed. Hold for now, restart on 09/25/2013.       No current facility-administered medications for this visit.    Allergies:   No Known Allergies  Social History:  The patient  reports that he has quit smoking. He does not have any smokeless tobacco history on file. He reports that he does not drink alcohol or use illicit drugs.   Family History:  The patient's family history includes Breast cancer in his mother; CAD in his brother, father, and mother; Diabetes in his mother; Heart failure in his brother; Prostate cancer in his father.   ROS:  Please see the history of present  illness.  No nausea, vomiting.  No fevers, chills.  No focal weakness.  No dysuria.    All other systems reviewed and negative.   PHYSICAL EXAM: VS:  BP 110/54  Pulse 58  Ht 6\' 1"  (1.854 m)  Wt 263 lb (119.296 kg)  BMI 34.71 kg/m2 Well nourished, well developed, in no acute distress HEENT: normal Neck: no JVD, no carotid bruits Cardiac:  normal S1, S2; RRR Lungs:  clear to auscultation bilaterally, no wheezing, rhonchi or rales Abd: soft, nontender, no hepatomegaly Ext: mild bilateral edema Skin: warm and dry Neuro:   no focal abnormalities noted  EKG:  AFib, slow ventricular response, RBBB  , left posterior fascicular block  ASSESSMENT AND PLAN:  Atrial fibrillation  Off of Metoprolol Succinate Tablet Extended Release 24 Hour, 25 MG, 1 tablet, Twice a day Continue Verapamil HCl Capsule Extended Release 24 Hour, 240 MG, 1  capsule every morning on an empty stomach, Orally, Once a day IMAGING: EKG    Harward,Amy 07/30/2012 10:12:44 AM > [name],JAY 07/30/2012 10:42:18 AM > AFib, RBBB, rate controlled   Notes: rate controlled. No dizziness, no need for more rate control meds. No CAD issues so will stop aspirin.    2. Unspecified sleep apnea  Notes: Continue to use CPAP.    3. Obesity  Notes: He will work on weight loss. Lost about 10 lbs since pacer.  Diet is ok.     4. Fatigue/unsteadiness: resolved with pacer.  5.  Lipids in 4/15: TG elevated 266.  HDL 32; LDL 32.  Decrease lipitor to 10 mg daily.  Started Fish oil 2 grams BID.  6. DM: followed by PMD. Preventive Medicine  Adult topics discussed:  Diet: healthy diet, low calorie, low fat.  Exercise: 5 days a week, at least 30 minutes of aerobic exercise.      Signed, Mina Marble, MD, [facility] 11/10/2013 9:24 AM

## 2013-11-10 NOTE — Patient Instructions (Signed)
Your physician recommends that you continue on your current medications as directed. Please refer to the Current Medication list given to you today.  Your physician wants you to follow-up in: 1 year with Dr. Varanasi. You will receive a reminder letter in the mail two months in advance. If you don't receive a letter, please call our office to schedule the follow-up appointment.  

## 2013-11-16 ENCOUNTER — Other Ambulatory Visit: Payer: Medicare Other

## 2013-11-17 ENCOUNTER — Other Ambulatory Visit: Payer: Medicare Other

## 2013-11-18 ENCOUNTER — Other Ambulatory Visit (INDEPENDENT_AMBULATORY_CARE_PROVIDER_SITE_OTHER): Payer: Medicare Other | Admitting: *Deleted

## 2013-11-18 DIAGNOSIS — I4891 Unspecified atrial fibrillation: Secondary | ICD-10-CM

## 2013-11-18 LAB — LIPID PANEL
CHOLESTEROL: 119 mg/dL (ref 0–200)
HDL: 32.7 mg/dL — AB (ref >39.00)
LDL Cholesterol: 67 mg/dL (ref 0–99)
NonHDL: 86.3
Total CHOL/HDL Ratio: 4
Triglycerides: 98 mg/dL (ref 0.0–149.0)
VLDL: 19.6 mg/dL (ref 0.0–40.0)

## 2013-11-18 LAB — HEPATIC FUNCTION PANEL
ALBUMIN: 3.8 g/dL (ref 3.5–5.2)
ALT: 31 U/L (ref 0–53)
AST: 25 U/L (ref 0–37)
Alkaline Phosphatase: 48 U/L (ref 39–117)
Bilirubin, Direct: 0.1 mg/dL (ref 0.0–0.3)
Total Bilirubin: 0.9 mg/dL (ref 0.2–1.2)
Total Protein: 7 g/dL (ref 6.0–8.3)

## 2013-11-22 ENCOUNTER — Other Ambulatory Visit: Payer: Self-pay | Admitting: Cardiology

## 2013-11-22 DIAGNOSIS — E785 Hyperlipidemia, unspecified: Secondary | ICD-10-CM

## 2014-01-04 ENCOUNTER — Encounter: Payer: Self-pay | Admitting: Internal Medicine

## 2014-01-04 ENCOUNTER — Ambulatory Visit (INDEPENDENT_AMBULATORY_CARE_PROVIDER_SITE_OTHER): Payer: Medicare Other | Admitting: Internal Medicine

## 2014-01-04 VITALS — BP 132/68 | HR 60 | Ht 73.0 in | Wt 259.6 lb

## 2014-01-04 DIAGNOSIS — R001 Bradycardia, unspecified: Secondary | ICD-10-CM

## 2014-01-04 DIAGNOSIS — E785 Hyperlipidemia, unspecified: Secondary | ICD-10-CM

## 2014-01-04 DIAGNOSIS — I482 Chronic atrial fibrillation, unspecified: Secondary | ICD-10-CM

## 2014-01-04 LAB — MDC_IDC_ENUM_SESS_TYPE_INCLINIC
Battery Remaining Longevity: 114 mo
Brady Statistic RV Percent Paced: 100 %
Date Time Interrogation Session: 20151117050000
Lead Channel Pacing Threshold Amplitude: 1.4 V
Lead Channel Pacing Threshold Pulse Width: 0.4 ms
Lead Channel Setting Pacing Amplitude: 1.9 V
Lead Channel Setting Sensing Sensitivity: 2.5 mV
MDC IDC MSMT LEADCHNL RV IMPEDANCE VALUE: 870 Ohm
MDC IDC PG SERIAL: 119862
MDC IDC SET LEADCHNL RV PACING PULSEWIDTH: 0.4 ms
MDC IDC SET ZONE DETECTION INTERVAL: 375 ms

## 2014-01-04 NOTE — Assessment & Plan Note (Signed)
His ventricular rate is well-controlled as he is pacing 100% of the time at 60 bpm. Of course rate response is on as is minimally ventilation.

## 2014-01-04 NOTE — Progress Notes (Signed)
HPI [name] returns today for follow-up. He is a very pleasant 73 year old man with symptomatic bradycardia and chronic atrial fibrillation and hypertension. He underwent permanent pacemaker insertion approximately 3 months ago. In the interim he has done well. He denies chest pain, shortness of breath, or peripheral edema. No syncope. He remains active. No Known Allergies   Current Outpatient Prescriptions  Medication Sig Dispense Refill  . atorvastatin (LIPITOR) 20 MG tablet Take 0.5 tablets (10 mg total) by mouth daily.    Marland Kitchen glipiZIDE (GLUCOTROL) 10 MG tablet Take 10 mg by mouth 2 (two) times daily before a meal.    . lisinopril (PRINIVIL,ZESTRIL) 10 MG tablet Take 1 tablet (10 mg total) by mouth daily. 30 tablet 11  . metFORMIN (GLUCOPHAGE) 500 MG tablet Take 1,000 mg by mouth 2 (two) times daily with a meal.     . Multiple Vitamins-Minerals (MULTIVITAMIN PO) Take 1 tablet by mouth daily.     . Omega-3 Fatty Acids (FISH OIL) 1000 MG CAPS Take 2,000 mg by mouth 2 (two) times daily. CARLSON'S    . verapamil (CALAN-SR) 240 MG CR tablet Take 1 tablet (240 mg total) by mouth at bedtime. 90 tablet 0  . warfarin (COUMADIN) 10 MG tablet Take 1 tablet (10 mg total) by mouth daily. Except on Wednesdays, take 5 mg (1/2 tablet) as directed. Hold for now, restart on 09/25/2013.     No current facility-administered medications for this visit.     Past Medical History  Diagnosis Date  . Atrial fibrillation     failed cardioversion and passed, rate control and anticoagulation   . Unspecified sleep apnea   . Obesity, unspecified   . Hyperlipidemia   . Venous insufficiency     with lower extremity venous varicosities  . ED (erectile dysfunction)     does not desire treatment  . Adenomatous colon polyp   . Diabetes mellitus without complication     ROS:   All systems reviewed and negative except as noted in the HPI.   Past Surgical History  Procedure Laterality Date  .  Pacemaker insertion  09-22-2013    BSX single chamber pacemaker implanted by Dr Lovena Le for symptomatic bradycardia     Family History  Problem Relation Age of Onset  . CAD Mother   . Breast cancer Mother   . Diabetes Mother   . CAD Father   . Prostate cancer Father   . Heart failure Brother   . CAD Brother      History   Social History  . Marital Status: Married    Spouse Name: N/A    Number of Children: N/A  . Years of Education: N/A   Occupational History  . Not on file.   Social History Main Topics  . Smoking status: Former Research scientist (life sciences)  . Smokeless tobacco: Not on file  . Alcohol Use: No  . Drug Use: No  . Sexual Activity: Not on file   Other Topics Concern  . Not on file   Social History Narrative     BP 132/68 mmHg  Pulse 60  Ht 6\' 1"  (1.854 m)  Wt 259 lb 9.6 oz (117.754 kg)  BMI 34.26 kg/m2  SpO2 98%  Physical Exam:  Well appearing 73 year old man,NAD HEENT: Unremarkable Neck:  No JVD, no thyromegally Back:  No CVA tenderness Lungs:  Clear with no wheezes, rales, or rhonchi. HEART:  Regular rate rhythm, no murmurs, no rubs, no clicks Abd:  soft, positive bowel  sounds, no organomegally, no rebound, no guarding Ext:  2 plus pulses, no edema, no cyanosis, no clubbing Skin:  No rashes no nodules Neuro:  CN II through XII intact, motor grossly intact    DEVICE  Normal device function.  See PaceArt for details. 100% ventricular pacing  Assess/Plan:

## 2014-01-04 NOTE — Patient Instructions (Signed)
Your physician wants you to follow-up in: 09/2014 with Dr. Knox Saliva will receive a reminder letter in the mail two months in advance. If you don't receive a letter, please call our office to schedule the follow-up appointment.   Remote monitoring is used to monitor your Pacemaker or ICD from home. This monitoring reduces the number of office visits required to check your device to one time per year. It allows [place] to keep an eye on the functioning of your device to ensure it is working properly. You are scheduled for a device check from home on 04/06/14. You may send your transmission at any time that day. If you have a wireless device, the transmission will be sent automatically. After your physician reviews your transmission, you will receive a postcard with your next transmission date.

## 2014-01-04 NOTE — Assessment & Plan Note (Signed)
He is encouraged to lose weight. He is encouraged to maintain a low-fat time. He will continue his statin therapy.

## 2014-01-10 ENCOUNTER — Other Ambulatory Visit: Payer: Self-pay

## 2014-01-10 MED ORDER — VERAPAMIL HCL ER 240 MG PO TBCR: 240.0000 mg | EXTENDED_RELEASE_TABLET | Freq: Every day | ORAL | Status: DC

## 2014-01-10 MED ORDER — LISINOPRIL 10 MG PO TABS: 10.0000 mg | ORAL_TABLET | Freq: Every day | ORAL | Status: DC

## 2014-01-27 ENCOUNTER — Encounter (HOSPITAL_COMMUNITY): Payer: Self-pay | Admitting: Internal Medicine

## 2014-04-04 ENCOUNTER — Telehealth: Payer: Self-pay | Admitting: Interventional Cardiology

## 2014-04-04 NOTE — Telephone Encounter (Signed)
New Msg       Pt calling about remote device check.    Pt isn't sure of the procedure, requesting call to explain this to him.  Please call.

## 2014-04-04 NOTE — Telephone Encounter (Signed)
Informed pt how to send manual transmission.

## 2014-04-06 ENCOUNTER — Ambulatory Visit (INDEPENDENT_AMBULATORY_CARE_PROVIDER_SITE_OTHER): Payer: Medicare Other | Admitting: *Deleted

## 2014-04-06 DIAGNOSIS — I482 Chronic atrial fibrillation, unspecified: Secondary | ICD-10-CM

## 2014-04-06 NOTE — Progress Notes (Signed)
Remote pacemaker transmission.   

## 2014-04-07 ENCOUNTER — Other Ambulatory Visit: Payer: Self-pay | Admitting: Internal Medicine

## 2014-04-07 LAB — MDC_IDC_ENUM_SESS_TYPE_INCLINIC
Brady Statistic RV Percent Paced: 100 %
Lead Channel Impedance Value: 786 Ohm
Lead Channel Pacing Threshold Amplitude: 1.4 V
Lead Channel Setting Pacing Pulse Width: 0.4 ms
Lead Channel Setting Sensing Sensitivity: 2.5 mV
MDC IDC MSMT LEADCHNL RV PACING THRESHOLD PULSEWIDTH: 0.4 ms
MDC IDC MSMT LEADCHNL RV SENSING INTR AMPL: 6.5 mV
MDC IDC PG SERIAL: 119862
MDC IDC SET LEADCHNL RV PACING AMPLITUDE: 1.9 V
Zone Setting Detection Interval: 375 ms

## 2014-04-12 ENCOUNTER — Telehealth: Payer: Self-pay | Admitting: Internal Medicine

## 2014-04-12 NOTE — Telephone Encounter (Signed)
Pt states sensation he felt was similar to loss of capture threshold test during in-office interrogation. He was sitting on his bed for about 39min. He arose and felt presyncopal. He sat back to regain composure. Pt states he will review symptoms w/ his primary cardiologist at the next visit.   Pt does not have a functioning analog phone jack in his bedroom. He connects his Latitude into his modem at the other end of his house.  I faxed an order for a cell adapter so pt can leave Latitude on his night stand.

## 2014-04-12 NOTE — Telephone Encounter (Signed)
New message     On 04-06-14 pt did a pacer transmission.  While doing the transmission, pt had an "episode".  Felt like his pacemaker was turned off. Pt called 911 but cancelled the call after he began feeling better.  Is it possible that we recorded that episode and can look at it.  Please call pt and let him know

## 2014-04-15 ENCOUNTER — Encounter: Payer: Self-pay | Admitting: *Deleted

## 2014-04-26 ENCOUNTER — Encounter: Payer: Self-pay | Admitting: Internal Medicine

## 2014-05-30 ENCOUNTER — Other Ambulatory Visit: Payer: Self-pay

## 2014-05-30 MED ORDER — VERAPAMIL HCL ER 240 MG PO TBCR: 240.0000 mg | EXTENDED_RELEASE_TABLET | Freq: Every day | ORAL | Status: DC

## 2014-07-06 ENCOUNTER — Ambulatory Visit (INDEPENDENT_AMBULATORY_CARE_PROVIDER_SITE_OTHER): Payer: Medicare Other | Admitting: *Deleted

## 2014-07-06 DIAGNOSIS — R001 Bradycardia, unspecified: Secondary | ICD-10-CM

## 2014-07-06 NOTE — Progress Notes (Signed)
Remote pacemaker transmission.   

## 2014-07-14 ENCOUNTER — Other Ambulatory Visit: Payer: Self-pay | Admitting: *Deleted

## 2014-07-14 MED ORDER — LISINOPRIL 10 MG PO TABS: 10.0000 mg | ORAL_TABLET | Freq: Every day | ORAL | Status: DC

## 2014-07-18 LAB — CUP PACEART REMOTE DEVICE CHECK
Battery Remaining Longevity: 108 mo
Battery Remaining Percentage: 100 %
Date Time Interrogation Session: 20160518070100
Lead Channel Pacing Threshold Amplitude: 0.9 V
Lead Channel Pacing Threshold Pulse Width: 0.4 ms
Lead Channel Setting Pacing Amplitude: 1.4 V
Lead Channel Setting Pacing Pulse Width: 0.4 ms
MDC IDC MSMT LEADCHNL RV IMPEDANCE VALUE: 722 Ohm
MDC IDC PG SERIAL: 119862
MDC IDC SET LEADCHNL RV SENSING SENSITIVITY: 2.5 mV
MDC IDC STAT BRADY RV PERCENT PACED: 72 %
Zone Setting Detection Interval: 375 ms

## 2014-07-29 ENCOUNTER — Encounter: Payer: Self-pay | Admitting: Cardiology

## 2014-08-02 ENCOUNTER — Encounter: Payer: Self-pay | Admitting: Internal Medicine

## 2014-09-22 IMAGING — CR DG CHEST 1V PORT
1 series · 1 of 1 positions shown · non-contrast
Comparison: None available for comparison at time of study
interpretation.

CLINICAL DATA: Bradycardia.

EXAM:
PORTABLE CHEST - 1 VIEW

[AP]
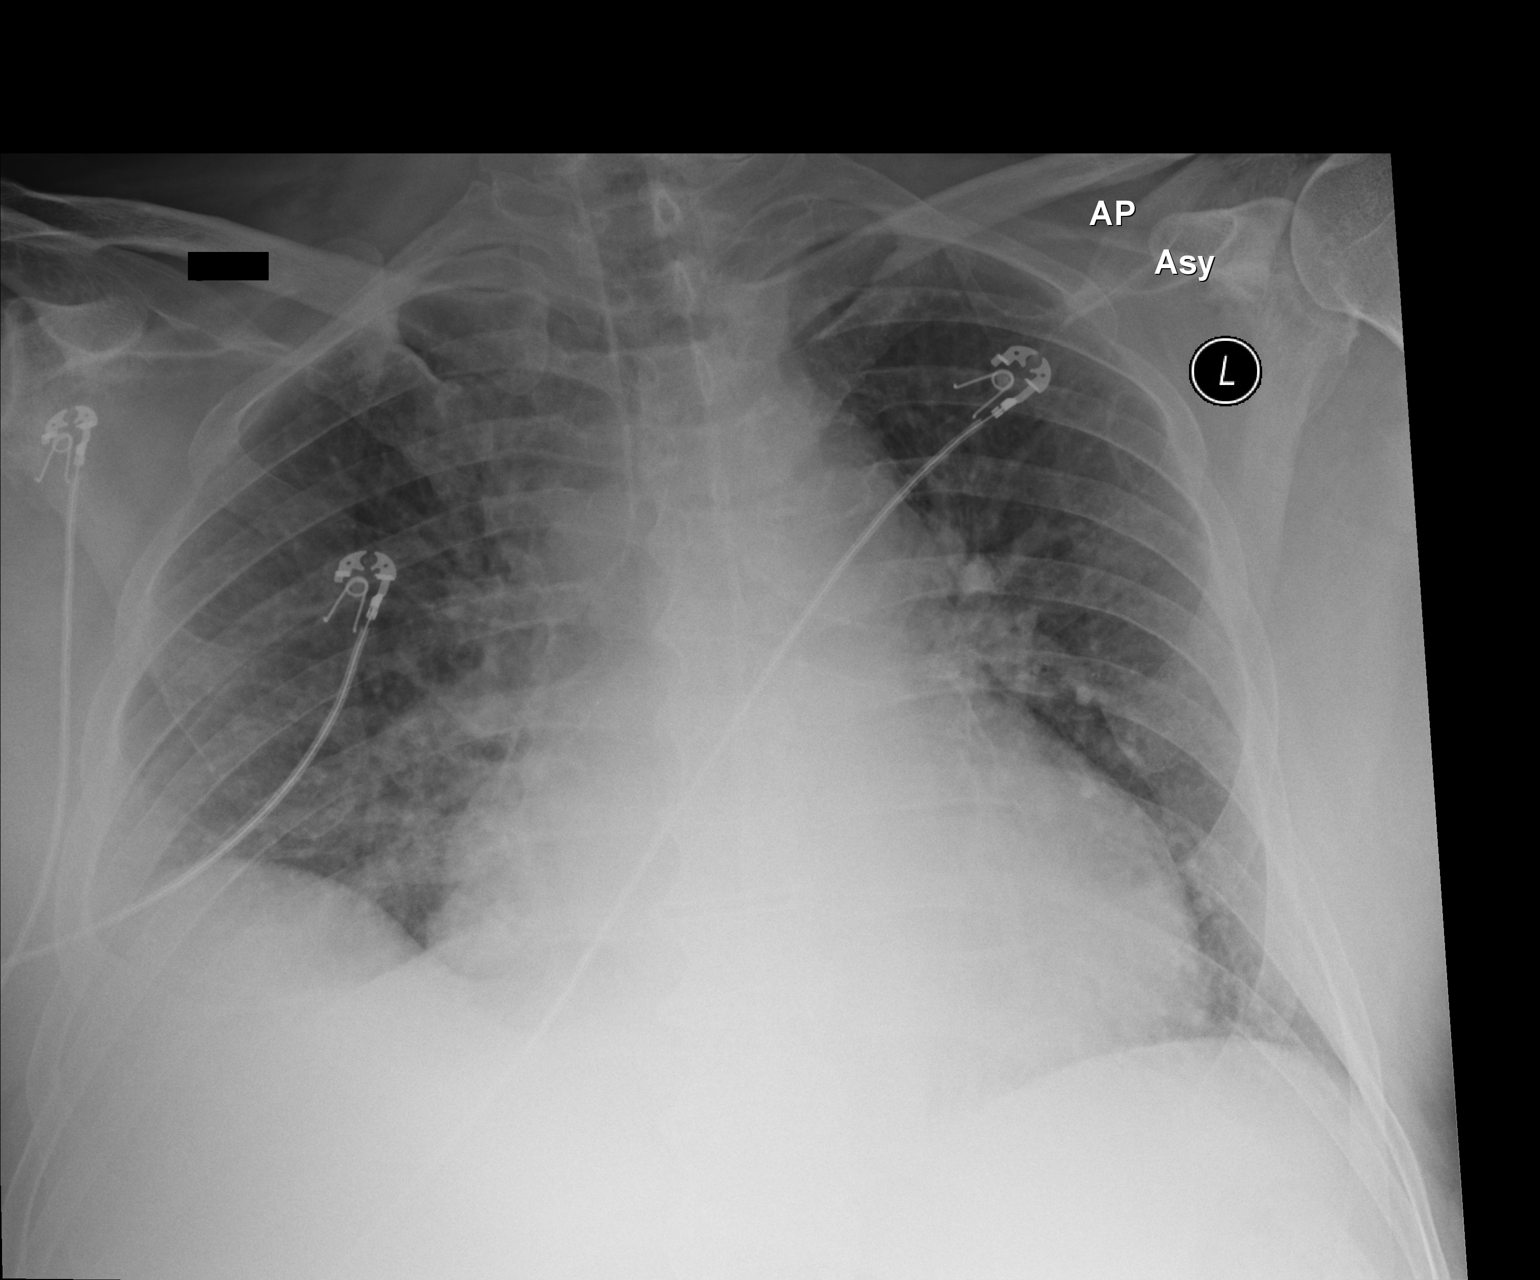

[1 of 1 positions shown; findings below may reference images not displayed]

FINDINGS: Cardiac silhouette appears at least mildly enlarged, even with
consideration to this low inspiratory portable examination with
crowded, centrally engorged vascular markings. No pleural effusions
or focal consolidations. Elevated right hemidiaphragm. No
pneumothorax.

Mild degenerative change of thoracic spine. Soft tissue planes are
unremarkable.
IMPRESSION: At least mild cardiomegaly, pulmonary vascular congestion.

  By: Rudi Jumper

## 2014-10-11 ENCOUNTER — Encounter: Payer: Self-pay | Admitting: Internal Medicine

## 2014-10-11 ENCOUNTER — Ambulatory Visit (INDEPENDENT_AMBULATORY_CARE_PROVIDER_SITE_OTHER): Payer: Medicare Other | Admitting: Internal Medicine

## 2014-10-11 VITALS — BP 100/68 | HR 68 | Ht 73.0 in | Wt 260.4 lb

## 2014-10-11 DIAGNOSIS — Z950 Presence of cardiac pacemaker: Secondary | ICD-10-CM

## 2014-10-11 DIAGNOSIS — R001 Bradycardia, unspecified: Secondary | ICD-10-CM

## 2014-10-11 DIAGNOSIS — I4891 Unspecified atrial fibrillation: Secondary | ICD-10-CM

## 2014-10-11 LAB — CUP PACEART INCLINIC DEVICE CHECK
Lead Channel Impedance Value: 739 Ohm
Lead Channel Pacing Threshold Amplitude: 1 V
Lead Channel Pacing Threshold Pulse Width: 0.4 ms
Lead Channel Sensing Intrinsic Amplitude: 9.2 mV
Lead Channel Setting Pacing Pulse Width: 0.4 ms
Lead Channel Setting Sensing Sensitivity: 2.5 mV
MDC IDC SESS DTM: 20160823040000
MDC IDC SET LEADCHNL RV PACING AMPLITUDE: 1.5 V
MDC IDC STAT BRADY RV PERCENT PACED: 65 %
Pulse Gen Serial Number: 119862
Zone Setting Detection Interval: 375 ms

## 2014-10-11 NOTE — Progress Notes (Signed)
HPI [name] returns today for follow-up. He is a very pleasant 74 year old man with symptomatic bradycardia and chronic atrial fibrillation and hypertension. He underwent permanent pacemaker insertion approximately 12 months ago. In the interim he has done well. He denies chest pain, shortness of breath, or peripheral edema. No syncope. He remains active. He has lost almost 30 lbs.  No Known Allergies   Current Outpatient Prescriptions  Medication Sig Dispense Refill  . atorvastatin (LIPITOR) 10 MG tablet Take 10 mg by mouth daily.  2  . glipiZIDE (GLUCOTROL) 10 MG tablet Take 5 mg by mouth 2 (two) times daily before a meal.     . lisinopril (PRINIVIL,ZESTRIL) 10 MG tablet Take 1 tablet (10 mg total) by mouth daily. 90 tablet 1  . metFORMIN (GLUCOPHAGE) 500 MG tablet Take 1,000 mg by mouth 2 (two) times daily with a meal.     . Multiple Vitamins-Minerals (MULTIVITAMIN PO) Take 1 tablet by mouth daily.     . Omega-3 Fatty Acids (FISH OIL) 1000 MG CAPS Take 2,000 mg by mouth 2 (two) times daily. Patrick Lozano    . verapamil (CALAN-SR) 240 MG CR tablet Take 1 tablet (240 mg total) by mouth at bedtime. 90 tablet 1  . warfarin (COUMADIN) 10 MG tablet Take as directed by the coumadin clinic     No current facility-administered medications for this visit.     Past Medical History  Diagnosis Date  . Atrial fibrillation     failed cardioversion and passed, rate control and anticoagulation   . Unspecified sleep apnea   . Obesity, unspecified   . Hyperlipidemia   . Venous insufficiency     with lower extremity venous varicosities  . ED (erectile dysfunction)     does not desire treatment  . Adenomatous colon polyp   . Diabetes mellitus without complication     ROS:   All systems reviewed and negative except as noted in the HPI.   Past Surgical History  Procedure Laterality Date  . Pacemaker insertion  09-22-2013    BSX single chamber pacemaker implanted by Dr Patrick Lozano for  symptomatic bradycardia  . Permanent pacemaker insertion N/A 09/22/2013    Procedure: PERMANENT PACEMAKER INSERTION;  Surgeon: Patrick Lance, MD;  Location[facility] CATH LAB;  Service: Cardiovascular;  Laterality: N/A;     Family History  Problem Relation Age of Onset  . CAD Mother   . Breast cancer Mother   . Diabetes Mother   . CAD Father   . Prostate cancer Father   . Heart failure Brother   . CAD Brother      Social History   Social History  . Marital Status: Married    Spouse Name: N/A  . Number of Children: N/A  . Years of Education: N/A   Occupational History  . Not on file.   Social History Main Topics  . Smoking status: Former Research scientist (life sciences)  . Smokeless tobacco: Not on file  . Alcohol Use: No  . Drug Use: No  . Sexual Activity: Not on file   Other Topics Concern  . Not on file   Social History Narrative     BP 100/68 mmHg  Pulse 68  Ht 6\' 1"  (1.854 m)  Wt 260 lb 6.4 oz (118.117 kg)  BMI 34.36 kg/m2  Physical Exam:  Well appearing 74 year old man,NAD HEENT: Unremarkable Neck:  6 cm JVD, no thyromegally Back:  No CVA tenderness Lungs:  Clear with no wheezes, rales, or rhonchi.  HEART:  Regular rate rhythm, no murmurs, no rubs, no clicks Abd:  soft, positive bowel sounds, no organomegally, no rebound, no guarding Ext:  2 plus pulses, no edema, no cyanosis, no clubbing Skin:  No rashes no nodules Neuro:  CN II through XII intact, motor grossly intact    DEVICE  Normal device function.  See PaceArt for details. 65% ventricular pacing  Assess/Plan:

## 2014-10-11 NOTE — Patient Instructions (Signed)
Medication Instructions:  Your physician recommends that you continue on your current medications as directed. Please refer to the Current Medication list given to you today.   Labwork: None ordered  Testing/Procedures: None ordered  Follow-Up: Remote monitoring is used to monitor your Pacemaker  from home. This monitoring reduces the number of office visits required to check your device to one time per year. It allows [place] to keep an eye on the functioning of your device to ensure it is working properly. You are scheduled for a device check from home on 01/10/15. You may send your transmission at any time that day. If you have a wireless device, the transmission will be sent automatically. After your physician reviews your transmission, you will receive a postcard with your next transmission date.   Your physician wants you to follow-up in: 12 months with Dr Knox Saliva will receive a reminder letter in the mail two months in advance. If you don't receive a letter, please call our office to schedule the follow-up appointment.   Any Other Special Instructions Will Be Listed Below (If Applicable).

## 2014-10-11 NOTE — Assessment & Plan Note (Signed)
His [place] VVI PM is working normally. Will recheck in several months.

## 2014-10-11 NOTE — Assessment & Plan Note (Signed)
His ventricular rate is well controlled. He will continue his current meds including warfarin.

## 2014-10-13 ENCOUNTER — Encounter: Payer: Self-pay | Admitting: Internal Medicine

## 2014-11-15 ENCOUNTER — Encounter: Payer: Self-pay | Admitting: Interventional Cardiology

## 2014-11-15 ENCOUNTER — Other Ambulatory Visit: Payer: Self-pay | Admitting: *Deleted

## 2014-11-15 ENCOUNTER — Ambulatory Visit (INDEPENDENT_AMBULATORY_CARE_PROVIDER_SITE_OTHER): Payer: Medicare Other | Admitting: Interventional Cardiology

## 2014-11-15 VITALS — BP 90/56 | HR 67 | Ht 73.0 in | Wt 261.0 lb

## 2014-11-15 DIAGNOSIS — E785 Hyperlipidemia, unspecified: Secondary | ICD-10-CM

## 2014-11-15 DIAGNOSIS — Z950 Presence of cardiac pacemaker: Secondary | ICD-10-CM

## 2014-11-15 DIAGNOSIS — I4891 Unspecified atrial fibrillation: Secondary | ICD-10-CM | POA: Diagnosis not present

## 2014-11-15 DIAGNOSIS — E119 Type 2 diabetes mellitus without complications: Secondary | ICD-10-CM

## 2014-11-15 MED ORDER — LISINOPRIL 10 MG PO TABS: 10.0000 mg | ORAL_TABLET | Freq: Every day | ORAL | Status: DC

## 2014-11-15 NOTE — Progress Notes (Signed)
Patient ID: Patrick Lozano, male   DOB: 16-Mar-1940, 74 y.o.   MRN: [id number]     Cardiology Office Note   Date:  11/15/2014   ID:  Patrick Lozano, DOB 1941-02-07, MRN [id number]  PCP:  Irven Shelling, MD    Chief Complaint  Patient presents with  . Follow-up    ANNUAL CHECK UP, AFIB     Wt Readings from Last 3 Encounters:  11/15/14 261 lb (118.389 kg)  10/11/14 260 lb 6.4 oz (118.117 kg)  01/04/14 259 lb 9.6 oz (117.754 kg)       History of Present Illness: Patrick Lozano is a 74 y.o. male  who has atrial fibrillation. He had a procedure for his varicose veins a few years ago. His legs feels somewhat better now. Pacer placed in 2015. Feeling much better. No bleeding problems.  Atrial Fibrillation F/U:  Resolved Dizziness while getting up from sitting position quickly.  c/o Leg edema minimal.  Denies : Chest pain.  Orthopnea.  Palpitations.  Shortness of breath.  Syncope.  He is trying to increase his walking duration.  Not gettig 150 minutes/week at this time.  Limited by hot weather.  BP readings have been low at home at times.  No dizziness even when he has low BP readings.      Past Medical History  Diagnosis Date  . Atrial fibrillation     failed cardioversion and passed, rate control and anticoagulation   . Unspecified sleep apnea   . Obesity, unspecified   . Hyperlipidemia   . Venous insufficiency     with lower extremity venous varicosities  . ED (erectile dysfunction)     does not desire treatment  . Adenomatous colon polyp   . Diabetes mellitus without complication     Past Surgical History  Procedure Laterality Date  . Pacemaker insertion  09-22-2013    BSX single chamber pacemaker implanted by Dr Lovena Le for symptomatic bradycardia  . Permanent pacemaker insertion N/A 09/22/2013    Procedure: PERMANENT PACEMAKER INSERTION;  Surgeon: Evans Lance, MD;  Location: [facility] CATH LAB;  Service: Cardiovascular;  Laterality: N/A;     Current  Outpatient Prescriptions  Medication Sig Dispense Refill  . atorvastatin (LIPITOR) 10 MG tablet Take 10 mg by mouth daily.  2  . glipiZIDE (GLUCOTROL) 10 MG tablet Take 5 mg by mouth 2 (two) times daily before a meal.     . lisinopril (PRINIVIL,ZESTRIL) 10 MG tablet Take 1 tablet (10 mg total) by mouth daily. 90 tablet 1  . metFORMIN (GLUCOPHAGE) 500 MG tablet Take 1,000 mg by mouth 2 (two) times daily with a meal.     . Multiple Vitamins-Minerals (MULTIVITAMIN PO) Take 1 tablet by mouth daily.     . Omega-3 Fatty Acids (FISH OIL) 1000 MG CAPS Take 2,000 mg by mouth 2 (two) times daily. CARLSON'S    . verapamil (CALAN-SR) 240 MG CR tablet Take 1 tablet (240 mg total) by mouth at bedtime. 90 tablet 1  . warfarin (COUMADIN) 10 MG tablet Take as directed by the coumadin clinic     No current facility-administered medications for this visit.    Allergies:   Review of patient's allergies indicates no known allergies.    Social History:  The patient  reports that he has quit smoking. He does not have any smokeless tobacco history on file. He reports that he does not drink alcohol or use illicit drugs.   Family History:  The patient's family history  includes Breast cancer in his mother; CAD in his brother, father, and mother; Diabetes in his mother; Heart failure in his brother; Prostate cancer in his father.    ROS:  Please see the history of present illness.   Otherwise, review of systems are positive for leg sewlling-varicose veins, knee pain.   All other systems are reviewed and negative.    PHYSICAL EXAM: VS:  BP 90/56 mmHg  Pulse 67  Ht 6\' 1"  (1.854 m)  Wt 261 lb (118.389 kg)  BMI 34.44 kg/m2  SpO2 96% , BMI Body mass index is 34.44 kg/(m^2). GEN: Well nourished, well developed, in no acute distress HEENT: normal Neck: no JVD, carotid bruits, or masses Cardiac: irregularly iregular; no murmurs, rubs, or gallops,no edema  Respiratory:  clear to auscultation bilaterally, normal work  of breathing GI: soft, nontender, nondistended, + BS MS: no deformity or atrophy Skin: warm and dry, no rash Neuro:  Strength and sensation are intact Psych: euthymic mood, full affect   EKG:   The ekg ordered today demonstrates AFib, RBBB, occasional paced beats   Recent Labs: 11/18/2013: ALT 31   Lipid Panel    Component Value Date/Time   CHOL 119 11/18/2013 1022   TRIG 98.0 11/18/2013 1022   HDL 32.70* 11/18/2013 1022   CHOLHDL 4 11/18/2013 1022   VLDL 19.6 11/18/2013 1022   LDLCALC 67 11/18/2013 1022     Other studies Reviewed: Additional studies/ records that were reviewed today with results demonstrating: echo: LV EF 55-60 in August 2015..   ASSESSMENT AND PLAN:  1. AFib: Coumadin or stroke prevention.  Rate controlled.  No sx.  2. Hyperlipidemia: Controlled.  WIll have his PMD check lipids next week at his physical.   3. *s/p pacer:  Feels much better with pacer. 4. HTN: Some borderline low blood pressure readings. He has not had any symptoms of lightheadedness even when his systolic is in the 71Q. If he does have symptoms, he should let us know. We would likely cut back on the lisinopril. 5. Diabetes: Managed by primary care physician.   Current medicines are reviewed at length with the patient today.  The patient concerns regarding his medicines were addressed.  The following changes have been made:  No change  Labs/ tests ordered today include:  No orders of the defined types were placed in this encounter.    Recommend 150 minutes/week of aerobic exercise Low fat, low carb, high fiber diet recommended  Disposition:   FU in one year, he will also follow-up with Dr. Runell Gess., MD  11/15/2014 10:53 AM    [address] [place], [place], [place]  [address] Phone: [phone]; Fax: [phone]

## 2014-11-15 NOTE — Patient Instructions (Signed)
Medication Instructions:  Your physician recommends that you continue on your current medications as directed. Please refer to the Current Medication list given to you today.   Labwork: None ordered  Testing/Procedures: None ordered  Follow-Up: Your physician wants you to follow-up in: 1 year with Dr.Varanasi You will receive a reminder letter in the mail two months in advance. If you don't receive a letter, please call our office to schedule the follow-up appointment.   Any Other Special Instructions Will Be Listed Below (If Applicable).

## 2015-01-10 ENCOUNTER — Ambulatory Visit (INDEPENDENT_AMBULATORY_CARE_PROVIDER_SITE_OTHER): Payer: Medicare Other | Admitting: *Deleted

## 2015-01-10 DIAGNOSIS — I4891 Unspecified atrial fibrillation: Secondary | ICD-10-CM

## 2015-01-10 NOTE — Progress Notes (Signed)
Remote pacemaker transmission.   

## 2015-01-18 ENCOUNTER — Other Ambulatory Visit: Payer: Self-pay

## 2015-01-18 MED ORDER — VERAPAMIL HCL ER 240 MG PO TBCR: 240.0000 mg | EXTENDED_RELEASE_TABLET | Freq: Every day | ORAL | Status: DC

## 2015-01-23 ENCOUNTER — Other Ambulatory Visit: Payer: Self-pay

## 2015-01-23 LAB — CUP PACEART REMOTE DEVICE CHECK
Battery Remaining Longevity: 102 mo
Battery Remaining Percentage: 100 %
Implantable Lead Implant Date: 20150805
Implantable Lead Location: 753860
Implantable Lead Model: 4137
Implantable Lead Serial Number: 29478499
Lead Channel Pacing Threshold Pulse Width: 0.4 ms
Lead Channel Setting Sensing Sensitivity: 2.5 mV
MDC IDC MSMT LEADCHNL RV IMPEDANCE VALUE: 836 Ohm
MDC IDC MSMT LEADCHNL RV PACING THRESHOLD AMPLITUDE: 0.9 V
MDC IDC PG SERIAL: 119862
MDC IDC SESS DTM: 20161122080100
MDC IDC SET LEADCHNL RV PACING AMPLITUDE: 1.3 V
MDC IDC SET LEADCHNL RV PACING PULSEWIDTH: 0.4 ms
MDC IDC STAT BRADY RV PERCENT PACED: 50 %

## 2015-01-23 NOTE — Telephone Encounter (Signed)
Medication Detail      Disp Refills Start End     verapamil (CALAN-SR) 240 MG CR tablet 90 tablet 3 01/18/2015     Sig - Route: Take 1 tablet (240 mg total) by mouth at bedtime. - Oral    E-Prescribing Status: Receipt confirmed by pharmacy (01/18/2015 2:39 PM EST)     Pharmacy    [facility] ([facility] [place], [place]

## 2015-01-24 ENCOUNTER — Encounter: Payer: Self-pay | Admitting: Cardiology

## 2015-03-07 ENCOUNTER — Telehealth: Payer: Self-pay | Admitting: Interventional Cardiology

## 2015-03-07 MED ORDER — LISINOPRIL 10 MG PO TABS
5.0000 mg | ORAL_TABLET | Freq: Every day | ORAL | Status: DC
Start: 2015-03-07 — End: 2016-03-26

## 2015-03-07 NOTE — Telephone Encounter (Signed)
OK to decrease lisinopril to half the current dose.

## 2015-03-07 NOTE — Telephone Encounter (Signed)
Called patient and informed him that he could take half of his lisinopril dose. Patient was fine with taking half of his lisinopril and if he has problems cutting pill in half, he would call to get a new prescription.

## 2015-03-07 NOTE — Telephone Encounter (Signed)
Patient's BP has been running low and patient stated he has had several extremely low BP's since his last OV. Today BP 88/46 and HR 60. Patient stated that his SBP runs regularly between 100- 116, DBP 70-80. Patient wants to see if Dr. Irish Lack wants to reduce some of his BP medications. Will forward to Dr. Irish Lack for advisement.

## 2015-03-07 NOTE — Telephone Encounter (Signed)
Pt calling re BP being too low and he is not feeling well because of this, wants to reduce BP med but he is taking several and doesn't know which one-pls advise [phone]

## 2015-04-11 ENCOUNTER — Ambulatory Visit (INDEPENDENT_AMBULATORY_CARE_PROVIDER_SITE_OTHER): Payer: Medicare Other | Admitting: *Deleted

## 2015-04-11 DIAGNOSIS — I482 Chronic atrial fibrillation, unspecified: Secondary | ICD-10-CM

## 2015-04-12 NOTE — Progress Notes (Signed)
Remote pacemaker transmission.   

## 2015-04-23 ENCOUNTER — Encounter: Payer: Self-pay | Admitting: Cardiology

## 2015-04-23 LAB — CUP PACEART REMOTE DEVICE CHECK
Battery Remaining Longevity: 102 mo
Battery Remaining Percentage: 100 %
Brady Statistic RV Percent Paced: 50 %
Implantable Lead Implant Date: 20150805
Implantable Lead Location: 753860
Implantable Lead Model: 4137
Implantable Lead Serial Number: 29478499
Lead Channel Pacing Threshold Pulse Width: 0.4 ms
Lead Channel Setting Sensing Sensitivity: 2.5 mV
MDC IDC MSMT LEADCHNL RV IMPEDANCE VALUE: 798 Ohm
MDC IDC MSMT LEADCHNL RV PACING THRESHOLD AMPLITUDE: 0.9 V
MDC IDC PG SERIAL: 119862
MDC IDC SESS DTM: 20170221080100
MDC IDC SET LEADCHNL RV PACING AMPLITUDE: 1.3 V
MDC IDC SET LEADCHNL RV PACING PULSEWIDTH: 0.4 ms

## 2015-04-23 NOTE — Progress Notes (Signed)
Normal remote reviewed.  [facility] [place] 07/11/15

## 2015-06-21 ENCOUNTER — Other Ambulatory Visit: Payer: Self-pay | Admitting: Gastroenterology

## 2015-06-26 ENCOUNTER — Encounter (HOSPITAL_COMMUNITY): Payer: Self-pay | Admitting: *Deleted

## 2015-07-03 ENCOUNTER — Ambulatory Visit (HOSPITAL_COMMUNITY): Payer: Medicare Other | Admitting: Anesthesiology

## 2015-07-03 ENCOUNTER — Encounter (HOSPITAL_COMMUNITY)
Admission: RE | Disposition: A | Discharge status: Home / Self Care | Payer: Self-pay | Source: Ambulatory Visit | Attending: Gastroenterology

## 2015-07-03 ENCOUNTER — Ambulatory Visit (HOSPITAL_COMMUNITY)
Admission: RE | Admit: 2015-07-03 | Discharge: 2015-07-03 | Disposition: A | Discharge status: Home / Self Care | Payer: Medicare Other | Source: Ambulatory Visit | Attending: Gastroenterology | Admitting: Gastroenterology

## 2015-07-03 ENCOUNTER — Encounter (HOSPITAL_COMMUNITY): Payer: Self-pay | Admitting: *Deleted

## 2015-07-03 DIAGNOSIS — Z86010 Personal history of colonic polyps: Secondary | ICD-10-CM | POA: Diagnosis not present

## 2015-07-03 DIAGNOSIS — Z87891 Personal history of nicotine dependence: Secondary | ICD-10-CM | POA: Insufficient documentation

## 2015-07-03 DIAGNOSIS — D123 Benign neoplasm of transverse colon: Secondary | ICD-10-CM | POA: Diagnosis not present

## 2015-07-03 DIAGNOSIS — K621 Rectal polyp: Secondary | ICD-10-CM | POA: Diagnosis not present

## 2015-07-03 DIAGNOSIS — G4733 Obstructive sleep apnea (adult) (pediatric): Secondary | ICD-10-CM | POA: Insufficient documentation

## 2015-07-03 DIAGNOSIS — Z7901 Long term (current) use of anticoagulants: Secondary | ICD-10-CM | POA: Insufficient documentation

## 2015-07-03 DIAGNOSIS — I482 Chronic atrial fibrillation: Secondary | ICD-10-CM | POA: Diagnosis not present

## 2015-07-03 DIAGNOSIS — E119 Type 2 diabetes mellitus without complications: Secondary | ICD-10-CM | POA: Diagnosis not present

## 2015-07-03 DIAGNOSIS — Z950 Presence of cardiac pacemaker: Secondary | ICD-10-CM | POA: Insufficient documentation

## 2015-07-03 DIAGNOSIS — D120 Benign neoplasm of cecum: Secondary | ICD-10-CM | POA: Insufficient documentation

## 2015-07-03 DIAGNOSIS — Z1211 Encounter for screening for malignant neoplasm of colon: Secondary | ICD-10-CM | POA: Insufficient documentation

## 2015-07-03 DIAGNOSIS — E7800 Pure hypercholesterolemia, unspecified: Secondary | ICD-10-CM | POA: Diagnosis not present

## 2015-07-03 HISTORY — PX: COLONOSCOPY WITH PROPOFOL: SHX5780

## 2015-07-03 HISTORY — DX: Unspecified osteoarthritis, unspecified site: M19.90

## 2015-07-03 HISTORY — DX: Essential (primary) hypertension: I10

## 2015-07-03 HISTORY — DX: Cardiac arrhythmia, unspecified: I49.9

## 2015-07-03 LAB — GLUCOSE, CAPILLARY: GLUCOSE-CAPILLARY: 182 mg/dL — AB (ref 65–99)

## 2015-07-03 SURGERY — COLONOSCOPY WITH PROPOFOL
Anesthesia: Monitor Anesthesia Care

## 2015-07-03 MED ORDER — LACTATED RINGERS IV SOLN
INTRAVENOUS | Status: DC
  Administered 2015-07-03: 1000 mL via INTRAVENOUS
  Administered 2015-07-03: 09:00:00 via INTRAVENOUS

## 2015-07-03 MED ORDER — PROPOFOL 500 MG/50ML IV EMUL
INTRAVENOUS | Status: DC | PRN
  Administered 2015-07-03: 30 mg via INTRAVENOUS

## 2015-07-03 MED ORDER — SODIUM CHLORIDE 0.9 % IV SOLN: INTRAVENOUS | Status: DC

## 2015-07-03 MED ORDER — PROPOFOL 10 MG/ML IV BOLUS
INTRAVENOUS | Status: AC
  Filled 2015-07-03: qty 20

## 2015-07-03 MED ORDER — PROPOFOL 500 MG/50ML IV EMUL
INTRAVENOUS | Status: DC | PRN
  Administered 2015-07-03: 125 ug/kg/min via INTRAVENOUS

## 2015-07-03 SURGICAL SUPPLY — 21 items

## 2015-07-03 NOTE — Discharge Instructions (Signed)
Colonoscopy, Care After °Refer to this sheet in the next few weeks. These instructions provide you with information on caring for yourself after your procedure. Your health care provider may also give you more specific instructions. Your treatment has been planned according to current medical practices, but problems sometimes occur. Call your health care provider if you have any problems or questions after your procedure. °WHAT TO EXPECT AFTER THE PROCEDURE  °After your procedure, it is typical to have the following: °· A small amount of blood in your stool. °· Moderate amounts of gas and mild abdominal cramping or bloating. °HOME CARE INSTRUCTIONS °· Do not drive, operate machinery, or sign important documents for 24 hours. °· You may shower and resume your regular physical activities, but move at a slower pace for the first 24 hours. °· Take frequent rest periods for the first 24 hours. °· Walk around or put a warm pack on your abdomen to help reduce abdominal cramping and bloating. °· Drink enough fluids to keep your urine clear or pale yellow. °· You may resume your normal diet as instructed by your health care provider. Avoid heavy or fried foods that are hard to digest. °· Avoid drinking alcohol for 24 hours or as instructed by your health care provider. °· Only take over-the-counter or prescription medicines as directed by your health care provider. °· If a tissue sample (biopsy) was taken during your procedure: °¨ Do not take aspirin or blood thinners for 7 days, or as instructed by your health care provider. °¨ Do not drink alcohol for 7 days, or as instructed by your health care provider. °¨ Eat soft foods for the first 24 hours. °SEEK MEDICAL CARE IF: °You have persistent spotting of blood in your stool 2-3 days after the procedure. °SEEK IMMEDIATE MEDICAL CARE IF: °· You have more than a small spotting of blood in your stool. °· You pass large blood clots in your stool. °· Your abdomen is swollen  (distended). °· You have nausea or vomiting. °· You have a fever. °· You have increasing abdominal pain that is not relieved with medicine. °  °This information is not intended to replace advice given to you by your health care provider. Make sure you discuss any questions you have with your health care provider. °  °Document Released: 09/19/2003 Document Revised: 11/25/2012 Document Reviewed: 10/12/2012 °Elsevier Interactive Patient Education ©2016 [employer] ° °

## 2015-07-03 NOTE — Anesthesia Preprocedure Evaluation (Signed)
Anesthesia Evaluation  Patient identified by MRN, date of birth, ID band Patient awake    Reviewed: Allergy & Precautions, NPO status , Patient's Chart, lab work & pertinent test results  Airway Mallampati: II       Dental  (+) Teeth Intact   Pulmonary sleep apnea and Continuous Positive Airway Pressure Ventilation , former smoker,    breath sounds clear to auscultation       Cardiovascular hypertension, + dysrhythmias Atrial Fibrillation + pacemaker  Rhythm:Regular Rate:Normal     Neuro/Psych    GI/Hepatic negative GI ROS, Neg liver ROS,   Endo/Other  diabetes  Renal/GU negative Renal ROS     Musculoskeletal  (+) Arthritis ,   Abdominal   Peds  Hematology negative hematology ROS (+)   Anesthesia Other Findings   Reproductive/Obstetrics                             Anesthesia Physical Anesthesia Plan  ASA: III  Anesthesia Plan: MAC   Post-op Pain Management:    Induction: Intravenous  Airway Management Planned: Natural Airway and Simple Face Mask  Additional Equipment:   Intra-op Plan:   Post-operative Plan: Extubation in OR  Informed Consent: I have reviewed the patients History and Physical, chart, labs and discussed the procedure including the risks, benefits and alternatives for the proposed anesthesia with the patient or authorized representative who has indicated his/her understanding and acceptance.     Plan Discussed with: CRNA and Surgeon  Anesthesia Plan Comments:         Anesthesia Quick Evaluation

## 2015-07-03 NOTE — Transfer of Care (Signed)
Immediate Anesthesia Transfer of Care Note  Patient: Patrick Lozano  Procedure(s) Performed: Procedure(s): COLONOSCOPY WITH PROPOFOL (N/A)  Patient Location: PACU  Anesthesia Type:MAC  Level of Consciousness:  sedated, patient cooperative and responds to stimulation  Airway & Oxygen Therapy:Patient Spontanous Breathing and Patient connected to face mask oxgen  Post-op Assessment:  Report given to PACU RN and Post -op Vital signs reviewed and stable  Post vital signs:  Reviewed and stable  Last Vitals:  Filed Vitals:   07/03/15 0838  BP: 123/44  Temp: 36.7 C  Resp: 19    Complications: No apparent anesthesia complications

## 2015-07-03 NOTE — Anesthesia Postprocedure Evaluation (Signed)
Anesthesia Post Note  Patient: Patrick Lozano  Procedure(s) Performed: Procedure(s) (LRB): COLONOSCOPY WITH PROPOFOL (N/A)  Patient location during evaluation: Endoscopy Anesthesia Type: MAC Level of consciousness: awake and alert Pain management: pain level controlled Vital Signs Assessment: post-procedure vital signs reviewed and stable Respiratory status: spontaneous breathing, nonlabored ventilation, respiratory function stable and patient connected to nasal cannula oxygen Cardiovascular status: stable and blood pressure returned to baseline Anesthetic complications: no    Last Vitals:  Filed Vitals:   07/03/15 0838 07/03/15 0936  BP: 123/44   Pulse:  99  Temp: 36.7 C   Resp: 19 17    Last Pain: There were no vitals filed for this visit.               Jaquarious Grey,JAMES TERRILL

## 2015-07-03 NOTE — H&P (Signed)
  Procedure: Surveillance colonoscopy. 04/03/2010 colonoscopy was performed with removal of a 4 mm mid transverse colon adenomatous polyp. Chronic Coumadin anticoagulation therapy to treat chronic atrial fibrillation.  History: The patient is a 75 year old male born 12-Jan-1941. He is scheduled to undergo a surveillance colonoscopy today. He stopped taking Coumadin 5 days ago.  Past medical history: Type 2 diabetes mellitus. Permanent atrial fibrillation treated with chronic Coumadin therapy and rate control. Complete heart block requiring permanent pacemaker placement. Hypercholesterolemia. Obstructive sleep apnea syndrome. Venous insufficiency of the lower extremities. Right inguinal herniorrhaphy. Cataract surgery. Laser surgery to the right eye.  Exam: The patient is alert and lying comfortably on the endoscopy stretcher. Abdomen is soft and nontender to palpation. Lungs are clear to auscultation. Cardiac exam reveals an irregular rhythm consistent with permanent atrial fibrillation.

## 2015-07-03 NOTE — Op Note (Signed)
[facility] Patient Name: Patrick Lozano Procedure Date: 07/03/2015 MRN: [id number] Attending MD: Garlan Fair , MD Date of Birth: 09-01-1940 CSN: [id number] Age: 75 Admit Type: Outpatient Procedure:                Colonoscopy Indications:              High risk colon cancer surveillance: Personal                            history of non-advanced adenoma Providers:                Garlan Fair, MD, Laverta Baltimore, RN,                            Vista Lawman, RN, Cherylynn Ridges, Technician, Arnoldo Hooker, CRNA Referring MD:              Medicines:                Propofol per Anesthesia Complications:            No immediate complications. Estimated Blood Loss:     Estimated blood loss: none. Procedure:                Pre-Anesthesia Assessment:                           - Prior to the procedure, a History and Physical                            was performed, and patient medications and                            allergies were reviewed. The patient's tolerance of                            previous anesthesia was also reviewed. The risks                            and benefits of the procedure and the sedation                            options and risks were discussed with the patient.                            All questions were answered, and informed consent                            was obtained. Prior Anticoagulants: The patient has                            taken Coumadin (warfarin), last dose was 5 days                            prior  to procedure. ASA Grade Assessment: III - A                            patient with severe systemic disease. After                            reviewing the risks and benefits, the patient was                            deemed in satisfactory condition to undergo the                            procedure.                           After obtaining informed consent, the colonoscope            was passed under direct vision. Throughout the                            procedure, the patient's blood pressure, pulse, and                            oxygen saturations were monitored continuously. The                            [id number]) scope was introduced through                            the anus and advanced to the the cecum, identified                            by appendiceal orifice and ileocecal valve. The                            colonoscopy was somewhat difficult due to                            significant looping. The patient tolerated the                            procedure well. The quality of the bowel                            preparation was good. The appendiceal orifice and                            the rectum were photographed. Scope In: 8:59:41 AM Scope Out: 9:29:10 AM Scope Withdrawal Time: 0 hours 19 minutes 11 seconds  Total Procedure Duration: 0 hours 29 minutes 29 seconds  Findings:      The perianal and digital rectal examinations were normal.      A 5 mm polyp was found in the cecum. The polyp was sessile. The polyp       was removed with a cold snare. Resection and retrieval  were complete.      A 5 mm polyp was found in the proximal transverse colon. The polyp was       sessile. The polyp was removed with a cold snare. Resection and       retrieval were complete.      A 4 mm polyp was found in the rectum. The polyp was sessile. The polyp       was removed with a cold snare. Resection and retrieval were complete.      The exam was otherwise without abnormality. Impression:               - One 5 mm polyp in the cecum, removed with a cold                            snare. Resected and retrieved.                           - One 5 mm polyp in the proximal transverse colon,                            removed with a cold snare. Resected and retrieved.                           - One 4 mm polyp in the rectum, removed with a cold                             snare. Resected and retrieved.                           - The examination was otherwise normal. Moderate Sedation:      N/A- Per Anesthesia Care Recommendation:           - Patient has a contact number available for                            emergencies. The signs and symptoms of potential                            delayed complications were discussed with the                            patient. Return to normal activities tomorrow.                            Written discharge instructions were provided to the                            patient.                           - Repeat colonoscopy is not recommended for                            surveillance.                           -  Resume previous diet.                           - Continue present medications. Procedure Code(s):        --- Professional ---                           [phone], Colonoscopy, flexible; with removal of                            tumor(s), polyp(s), or other lesion(s) by snare                            technique Diagnosis Code(s):        --- Professional ---                           Z86.010, Personal history of colonic polyps                           D12.0, Benign neoplasm of cecum                           D12.3, Benign neoplasm of transverse colon (hepatic                            flexure or splenic flexure)                           K62.1, Rectal polyp CPT copyright 2016 American [employer]. All rights reserved. The codes documented in this report are preliminary and upon coder review may  be revised to meet current compliance requirements. Earle Gell, MD Garlan Fair, MD 07/03/2015 9:38:32 AM This report has been signed electronically. Number of Addenda: 0

## 2015-07-04 ENCOUNTER — Encounter (HOSPITAL_COMMUNITY): Payer: Self-pay | Admitting: Gastroenterology

## 2015-07-11 ENCOUNTER — Ambulatory Visit (INDEPENDENT_AMBULATORY_CARE_PROVIDER_SITE_OTHER): Payer: Medicare Other | Admitting: *Deleted

## 2015-07-11 DIAGNOSIS — R001 Bradycardia, unspecified: Secondary | ICD-10-CM

## 2015-07-11 DIAGNOSIS — Z950 Presence of cardiac pacemaker: Secondary | ICD-10-CM | POA: Diagnosis not present

## 2015-07-11 NOTE — Progress Notes (Signed)
Remote pacemaker transmission.   

## 2015-07-30 LAB — CUP PACEART REMOTE DEVICE CHECK
Brady Statistic RV Percent Paced: 53 %
Implantable Lead Implant Date: 20150805
Implantable Lead Model: 4137
Lead Channel Impedance Value: 784 Ohm
Lead Channel Pacing Threshold Amplitude: 0.8 V
Lead Channel Pacing Threshold Pulse Width: 0.4 ms
Lead Channel Setting Pacing Amplitude: 1.3 V
MDC IDC LEAD LOCATION: 753860
MDC IDC LEAD SERIAL: 29478499
MDC IDC MSMT BATTERY REMAINING LONGEVITY: 96 mo
MDC IDC MSMT BATTERY REMAINING PERCENTAGE: 100 %
MDC IDC SESS DTM: 20170523070100
MDC IDC SET LEADCHNL RV PACING PULSEWIDTH: 0.4 ms
MDC IDC SET LEADCHNL RV SENSING SENSITIVITY: 2.5 mV
Pulse Gen Serial Number: 119862

## 2015-08-08 ENCOUNTER — Encounter: Payer: Self-pay | Admitting: Cardiology

## 2015-10-02 ENCOUNTER — Encounter: Payer: Self-pay | Admitting: Internal Medicine

## 2015-10-13 ENCOUNTER — Encounter: Payer: Self-pay | Admitting: Internal Medicine

## 2015-10-13 ENCOUNTER — Ambulatory Visit (INDEPENDENT_AMBULATORY_CARE_PROVIDER_SITE_OTHER): Payer: Medicare Other | Admitting: Internal Medicine

## 2015-10-13 VITALS — BP 126/72 | HR 64 | Ht 73.0 in | Wt 261.0 lb

## 2015-10-13 DIAGNOSIS — I4891 Unspecified atrial fibrillation: Secondary | ICD-10-CM | POA: Diagnosis not present

## 2015-10-13 DIAGNOSIS — I482 Chronic atrial fibrillation, unspecified: Secondary | ICD-10-CM

## 2015-10-13 DIAGNOSIS — Z950 Presence of cardiac pacemaker: Secondary | ICD-10-CM | POA: Diagnosis not present

## 2015-10-13 NOTE — Patient Instructions (Addendum)
Medication Instructions:  Your physician recommends that you continue on your current medications as directed. Please refer to the Current Medication list given to you today.   Labwork: None Ordered   Testing/Procedures: None Ordered   Follow-Up: Remote monitoring is used to monitor your Pacemaker of ICD from home. This monitoring reduces the number of office visits required to check your device to one time per year. It allows us to keep an eye on the functioning of your device to ensure it is working properly. You are scheduled for a device check from home on 01/16/16. You may send your transmission at any time that day. If you have a wireless device, the transmission will be sent automatically. After your physician reviews your transmission, you will receive a postcard with your next transmission date.    Your physician wants you to follow-up in: 1 year with Dr. Taylor.  You will receive a reminder letter in the mail two months in advance. If you don't receive a letter, please call our office to schedule the follow-up appointment.   If you need a refill on your cardiac medications before your next appointment, please call your pharmacy.   Thank you for choosing [facility]! Michelle Swinyer, RN [phone]    

## 2015-10-13 NOTE — Progress Notes (Signed)
HPI Patrick Lozano returns today for follow-up. He is a very pleasant 75 year old man with symptomatic bradycardia and chronic atrial fibrillation and hypertension. He underwent permanent pacemaker insertion approximately 2 years ago. In the interim he has done well. He denies chest pain, shortness of breath, or peripheral edema. No syncope. He remains active. He has lost almost 30 lbs.  No Known Allergies   Current Outpatient Prescriptions  Medication Sig Dispense Refill  . atorvastatin (LIPITOR) 10 MG tablet Take 10 mg by mouth daily with supper.   2  . fluticasone (FLONASE) 50 MCG/ACT nasal spray Place 1 spray into both nostrils 2 (two) times daily as needed for allergies.    Marland Kitchen glipiZIDE (GLUCOTROL) 10 MG tablet Take 5 mg by mouth 2 (two) times daily before a meal.     . lisinopril (PRINIVIL,ZESTRIL) 10 MG tablet Take 0.5 tablets (5 mg total) by mouth daily. (Patient taking differently: Take 5 mg by mouth daily with supper. )    . metFORMIN (GLUCOPHAGE) 500 MG tablet Take 1,000 mg by mouth 2 (two) times daily with a meal.     . Multiple Vitamin (MULTIVITAMIN WITH MINERALS) TABS tablet Take 1 tablet by mouth daily with supper.     . Omega-3 Fatty Acids (FISH OIL) 1000 MG CAPS Take 2,000 mg by mouth 2 (two) times daily.     . verapamil (CALAN-SR) 240 MG CR tablet Take 1 tablet (240 mg total) by mouth at bedtime. (Patient taking differently: Take 240 mg by mouth daily with supper. ) 90 tablet 3  . warfarin (COUMADIN) 10 MG tablet 5-10 mg daily at 6 PM. He takes one tablet everyday except on Wednesdays. He takes half a tablet on that one day.     No current facility-administered medications for this visit.      Past Medical History:  Diagnosis Date  . Adenomatous colon polyp   . Arthritis    hands, knees  . Atrial fibrillation ([place])    failed cardioversion and passed, rate control and anticoagulation   . Diabetes mellitus without complication ([place])   . Dysrhythmia    Chronic Atrial  Fibrillation- in and out  . ED (erectile dysfunction)    does not desire treatment  . Hyperlipidemia   . Hypertension   . Obesity, unspecified   . Unspecified sleep apnea   . Venous insufficiency    with lower extremity venous varicosities    ROS:   All systems reviewed and negative except as noted in the HPI.   Past Surgical History:  Procedure Laterality Date  . COLONOSCOPY WITH PROPOFOL N/A 07/03/2015   Procedure: COLONOSCOPY WITH PROPOFOL;  Surgeon: Garlan Fair, MD;  Location: [facility];  Service: Endoscopy;  Laterality: N/A;  . HERNIA REPAIR     inguinal  . PACEMAKER INSERTION  09-22-2013   BSX single chamber pacemaker implanted by Dr Lovena Le for symptomatic bradycardia  . PERMANENT PACEMAKER INSERTION N/A 09/22/2013   Procedure: PERMANENT PACEMAKER INSERTION;  Surgeon: Evans Lance, MD;  Location: [facility] CATH LAB;  Service: Cardiovascular;  Laterality: N/A;  . VARICOSE VEIN SURGERY Bilateral      Family History  Problem Relation Age of Onset  . CAD Mother   . Breast cancer Mother   . Diabetes Mother   . CAD Father   . Prostate cancer Father   . Heart failure Brother   . CAD Brother      Social History   Social History  . Marital status:  Married    Spouse name: N/A  . Number of children: N/A  . Years of education: N/A   Occupational History  . Not on file.   Social History Main Topics  . Smoking status: Former Smoker    Quit date: 06/25/1985  . Smokeless tobacco: Never Used  . Alcohol use No  . Drug use: No  . Sexual activity: Not on file   Other Topics Concern  . Not on file   Social History Narrative  . No narrative on file     BP 126/72   Pulse 64   Ht 6\' 1"  (1.854 m)   Wt 261 lb (118.4 kg)   BMI 34.43 kg/m   Physical Exam:  Well appearing 75 year old man,NAD HEENT: Unremarkable Neck:  6 cm JVD, no thyromegally Back:  No CVA tenderness Lungs:  Clear with no wheezes, rales, or rhonchi. HEART:  Regular rate rhythm, no murmurs, no  rubs, no clicks Abd:  soft, positive bowel sounds, no organomegally, no rebound, no guarding Ext:  2 plus pulses, no edema, no cyanosis, no clubbing Skin:  No rashes no nodules Neuro:  CN II through XII intact, motor grossly intact  ECG - atria fib with a CVR  DEVICE  Normal device function.  See PaceArt for details. 56% ventricular pacing  Assess/Plan: 1. Chronic atrial fib - his ventricular rate is controlled. No change in meds. 2. PPM - his [place] [employer] VVI PM is working normally. Will recheck in several months. 3. HTN - his blood pressure is well controlled. Will follow.  Patrick Lozano.D.

## 2015-11-01 ENCOUNTER — Ambulatory Visit (INDEPENDENT_AMBULATORY_CARE_PROVIDER_SITE_OTHER): Payer: Medicare Other | Admitting: Interventional Cardiology

## 2015-11-01 ENCOUNTER — Encounter: Payer: Self-pay | Admitting: Interventional Cardiology

## 2015-11-01 VITALS — BP 114/56 | HR 60 | Ht 73.0 in | Wt 264.0 lb

## 2015-11-01 DIAGNOSIS — E785 Hyperlipidemia, unspecified: Secondary | ICD-10-CM | POA: Diagnosis not present

## 2015-11-01 DIAGNOSIS — I482 Chronic atrial fibrillation, unspecified: Secondary | ICD-10-CM

## 2015-11-01 DIAGNOSIS — E119 Type 2 diabetes mellitus without complications: Secondary | ICD-10-CM

## 2015-11-01 DIAGNOSIS — I10 Essential (primary) hypertension: Secondary | ICD-10-CM | POA: Diagnosis not present

## 2015-11-01 DIAGNOSIS — Z950 Presence of cardiac pacemaker: Secondary | ICD-10-CM

## 2015-11-01 NOTE — Progress Notes (Signed)
Patient ID: Patrick Lozano, male   DOB: 1940/07/30, 75 y.o.   MRN: [id number]     Cardiology Office Note   Date:  11/01/2015   ID:  Patrick Lozano, DOB [birth year], MRN [id number]  PCP:  Irven Shelling, MD    Chief Complaint  Patient presents with  . Follow-up  AFib   Wt Readings from Last 3 Encounters:  11/01/15 264 lb (119.7 kg)  10/13/15 261 lb (118.4 kg)  07/03/15 260 lb (117.9 kg)       History of Present Illness: [name] is a 75 y.o. male  who has atrial fibrillation. He had a procedure for his varicose veins a few years ago. His legs feels somewhat better now. Pacer placed in August 2015 for symptomatic bradycardia. Felt much better after this. No bleeding problems.  Atrial Fibrillation F/U:  Resolved Dizziness while getting up from sitting position quickly.  BP has been better since decreasing lisinopril. c/o Leg edema minimal.  Spider veins present. Denies : Chest pain.  Orthopnea.  Palpitations.  Shortness of breath.  Syncope.  He is trying to increase his walking duration.  Now getting 150 minutes/week at this time.  Limited by hot weather.  Mows his lawn with a push mower.  BP readings have been low at home at times after mowing the lawn.  No syncope or lightheadedness.        Past Medical History:  Diagnosis Date  . Adenomatous colon polyp   . Arthritis    hands, knees  . Atrial fibrillation ([place])    failed cardioversion and passed, rate control and anticoagulation   . Diabetes mellitus without complication ([place])   . Dysrhythmia    Chronic Atrial Fibrillation- in and out  . ED (erectile dysfunction)    does not desire treatment  . Hyperlipidemia   . Hypertension   . Obesity, unspecified   . Unspecified sleep apnea   . Venous insufficiency    with lower extremity venous varicosities    Past Surgical History:  Procedure Laterality Date  . COLONOSCOPY WITH PROPOFOL N/A 07/03/2015   Procedure: COLONOSCOPY WITH PROPOFOL;  Surgeon:  Garlan Fair, MD;  Location: [facility];  Service: Endoscopy;  Laterality: N/A;  . HERNIA REPAIR     inguinal  . PACEMAKER INSERTION  09-22-2013   BSX single chamber pacemaker implanted by Dr Lovena Le for symptomatic bradycardia  . PERMANENT PACEMAKER INSERTION N/A 09/22/2013   Procedure: PERMANENT PACEMAKER INSERTION;  Surgeon: Evans Lance, MD;  Location: [facility] CATH LAB;  Service: Cardiovascular;  Laterality: N/A;  . VARICOSE VEIN SURGERY Bilateral      Current Outpatient Prescriptions  Medication Sig Dispense Refill  . atorvastatin (LIPITOR) 10 MG tablet Take 10 mg by mouth daily with supper.   2  . fluticasone (FLONASE) 50 MCG/ACT nasal spray Place 1 spray into both nostrils 2 (two) times daily as needed for allergies.    Marland Kitchen glipiZIDE (GLUCOTROL) 10 MG tablet Take 5 mg by mouth 2 (two) times daily before a meal.     . lisinopril (PRINIVIL,ZESTRIL) 10 MG tablet Take 0.5 tablets (5 mg total) by mouth daily. (Patient taking differently: Take 5 mg by mouth daily with supper. )    . metFORMIN (GLUCOPHAGE) 500 MG tablet Take 1,000 mg by mouth 2 (two) times daily with a meal.     . Multiple Vitamin (MULTIVITAMIN WITH MINERALS) TABS tablet Take 1 tablet by mouth daily with supper.     . Omega-3 Fatty Acids (  FISH OIL) 1000 MG CAPS Take 2,000 mg by mouth 2 (two) times daily.     . verapamil (CALAN-SR) 240 MG CR tablet Take 1 tablet (240 mg total) by mouth at bedtime. (Patient taking differently: Take 240 mg by mouth daily with supper. ) 90 tablet 3  . warfarin (COUMADIN) 10 MG tablet 5-10 mg daily at 6 PM. He takes one tablet everyday except on Wednesdays. He takes half a tablet on that one day.     No current facility-administered medications for this visit.     Allergies:   Review of patient's allergies indicates no known allergies.    Social History:  The patient  reports that he quit smoking about 30 years ago. He has never used smokeless tobacco. He reports that he does not drink alcohol or  use drugs.   Family History:  The patient's family history includes Breast cancer in his mother; CAD in his brother, father, and mother; Diabetes in his mother; Heart failure in his brother; Prostate cancer in his father.    ROS:  Please see the history of present illness.   Otherwise, review of systems are positive for leg sewlling-varicose veins, knee pain.   All other systems are reviewed and negative.    PHYSICAL EXAM: VS:  BP (!) 114/56   Pulse 60   Ht 6\' 1"  (1.854 m)   Wt 264 lb (119.7 kg)   BMI 34.83 kg/m  , BMI Body mass index is 34.83 kg/m. GEN: Well nourished, well developed, in no acute distress  HEENT: normal  Neck: no JVD, carotid bruits, or masses Cardiac: irregularly iregular; no murmurs, rubs, or gallops,no edema  Respiratory:  clear to auscultation bilaterally, normal work of breathing GI: soft, nontender, nondistended, + BS MS: no deformity or atrophy  Skin: warm and dry, no rash Neuro:  Strength and sensation are intact Psych: euthymic mood, full affect   EKG:   The ekg ordered today demonstrates AFib, RBBB, occasional paced beats   Recent Labs: No results found for requested labs within last 8760 hours.   Lipid Panel    Component Value Date/Time   CHOL 119 11/18/2013 1022   TRIG 98.0 11/18/2013 1022   HDL 32.70 (L) 11/18/2013 1022   CHOLHDL 4 11/18/2013 1022   VLDL 19.6 11/18/2013 1022   LDLCALC 67 11/18/2013 1022     Other studies Reviewed: Additional studies/ records that were reviewed today with results demonstrating: echo: LV EF 55-60 in August 2015..   ASSESSMENT AND PLAN:  1. AFib: Coumadin or stroke prevention.  Rate controlled.  No sx.  2. Hyperlipidemia: Controlled.  Will have his PMD check lipids next week at his physical.   3. s/p pacer:  Feels much better with pacer.  Follows with Dr. Lovena Le. 4. HTN: Some borderline low blood pressure readings. Better with decreased lisinopril. Stay well hydrates 5. Diabetes: Managed by primary  care physician.   Current medicines are reviewed at length with the patient today.  The patient concerns regarding his medicines were addressed.  The following changes have been made:  No change  Labs/ tests ordered today include:  No orders of the defined types were placed in this encounter.   Recommend 150 minutes/week of aerobic exercise Low fat, low carb, high fiber diet recommended  Disposition:   FU in 18 months to stagger visits with EP, he will also follow-up with Dr. Lovena Le   Signed, Larae Grooms, MD  11/01/2015 4:13 PM    [place]  [address], [place], [place]  [address] Phone: [phone]; Fax: [phone]

## 2015-11-01 NOTE — Patient Instructions (Signed)
Medication Instructions:  Your physician recommends that you continue on your current medications as directed. Please refer to the Current Medication list given to you today.  Labwork: NONE  Testing/Procedures: NONE  Follow-Up: Your physician wants you to follow-up in: 18 months with Dr. Irish Lack. You will receive a reminder letter in the mail two months in advance. If you don't receive a letter, please call our office to schedule the follow-up appointment.   If you need a refill on your cardiac medications before your next appointment, please call your pharmacy.

## 2015-12-15 ENCOUNTER — Other Ambulatory Visit: Payer: Self-pay | Admitting: Interventional Cardiology

## 2015-12-15 MED ORDER — VERAPAMIL HCL ER 240 MG PO TBCR: 240.0000 mg | EXTENDED_RELEASE_TABLET | Freq: Every day | ORAL | 3 refills | Status: DC

## 2016-01-16 ENCOUNTER — Ambulatory Visit (INDEPENDENT_AMBULATORY_CARE_PROVIDER_SITE_OTHER): Payer: Medicare Other | Admitting: *Deleted

## 2016-01-16 DIAGNOSIS — R001 Bradycardia, unspecified: Secondary | ICD-10-CM | POA: Diagnosis not present

## 2016-01-17 NOTE — Progress Notes (Signed)
Remote pacemaker transmission.   

## 2016-01-18 ENCOUNTER — Encounter: Payer: Self-pay | Admitting: Cardiology

## 2016-02-29 LAB — CUP PACEART REMOTE DEVICE CHECK
Battery Remaining Longevity: 90 mo
Battery Remaining Percentage: 100 %
Brady Statistic RV Percent Paced: 92 %
Date Time Interrogation Session: 20171128080200
Implantable Lead Implant Date: 20150805
Implantable Lead Location: 753860
Implantable Lead Model: 4137
Implantable Lead Serial Number: 29478499
Implantable Pulse Generator Implant Date: 20150805
Lead Channel Impedance Value: 869 Ohm
Lead Channel Pacing Threshold Amplitude: 1.1 V
Lead Channel Pacing Threshold Pulse Width: 0.4 ms
Lead Channel Setting Pacing Amplitude: 1.5 V
Lead Channel Setting Pacing Pulse Width: 0.4 ms
Lead Channel Setting Sensing Sensitivity: 2.5 mV
Pulse Gen Serial Number: 119862

## 2016-03-26 ENCOUNTER — Other Ambulatory Visit: Payer: Self-pay | Admitting: *Deleted

## 2016-03-26 MED ORDER — LISINOPRIL 10 MG PO TABS: 5.0000 mg | ORAL_TABLET | Freq: Every day | ORAL | 2 refills | Status: DC

## 2016-04-16 ENCOUNTER — Telehealth: Payer: Self-pay | Admitting: Cardiology

## 2016-04-16 ENCOUNTER — Encounter: Payer: Medicare Other | Admitting: *Deleted

## 2016-04-16 NOTE — Telephone Encounter (Signed)
LMOVM reminding pt to send remote transmission.   

## 2016-04-22 ENCOUNTER — Ambulatory Visit (INDEPENDENT_AMBULATORY_CARE_PROVIDER_SITE_OTHER): Payer: Medicare Other | Admitting: *Deleted

## 2016-04-22 DIAGNOSIS — R001 Bradycardia, unspecified: Secondary | ICD-10-CM | POA: Diagnosis not present

## 2016-04-22 NOTE — Progress Notes (Signed)
Remote pacemaker transmission.   

## 2016-04-23 ENCOUNTER — Encounter: Payer: Self-pay | Admitting: Cardiology

## 2016-04-23 LAB — CUP PACEART REMOTE DEVICE CHECK
Battery Remaining Percentage: 100 %
Brady Statistic RV Percent Paced: 96 %
Implantable Lead Location: 753860
Implantable Lead Model: 4137
Implantable Lead Serial Number: 29478499
Lead Channel Impedance Value: 866 Ohm
Lead Channel Pacing Threshold Pulse Width: 0.4 ms
Lead Channel Setting Pacing Amplitude: 1.5 V
Lead Channel Setting Sensing Sensitivity: 2.5 mV
MDC IDC LEAD IMPLANT DT: 20150805
MDC IDC MSMT BATTERY REMAINING LONGEVITY: 90 mo
MDC IDC MSMT LEADCHNL RV PACING THRESHOLD AMPLITUDE: 1 V
MDC IDC PG IMPLANT DT: 20150805
MDC IDC SESS DTM: 20180304192200
MDC IDC SET LEADCHNL RV PACING PULSEWIDTH: 0.4 ms
Pulse Gen Serial Number: 119862

## 2016-06-20 ENCOUNTER — Ambulatory Visit
Admission: RE | Admit: 2016-06-20 | Discharge: 2016-06-20 | Disposition: A | Discharge status: Home / Self Care | Payer: Medicare Other | Source: Ambulatory Visit | Attending: Internal Medicine | Admitting: Internal Medicine

## 2016-06-20 ENCOUNTER — Other Ambulatory Visit: Payer: Self-pay | Admitting: Internal Medicine

## 2016-06-20 DIAGNOSIS — J189 Pneumonia, unspecified organism: Secondary | ICD-10-CM

## 2016-06-20 DIAGNOSIS — J181 Lobar pneumonia, unspecified organism: Principal | ICD-10-CM

## 2016-06-20 DIAGNOSIS — M79642 Pain in left hand: Secondary | ICD-10-CM

## 2016-07-16 ENCOUNTER — Telehealth: Payer: Self-pay | Admitting: Interventional Cardiology

## 2016-07-16 NOTE — Telephone Encounter (Signed)
New message       Pt c/o BP issue: STAT if pt c/o blurred vision, one-sided weakness or slurred speech  1. What are your last 5 BP readings?  95/45, 91/41  2. Are you having any other symptoms (ex. Dizziness, headache, blurred vision, passed out)?  Fatigue 3. What is your BP issue?  bp really low. Please advise

## 2016-07-16 NOTE — Telephone Encounter (Signed)
Pt complaining of having low BP and feeling fatigued. He says his diastolic BP never comes over 55. Its normally between 40-55. He stated he was doing some yard work last week and he could barely make it in the house afterward because he was so weak. He states he has episodes a few times a week where he feels fatigued and his BP is low. His recorded BP were 95/45 and 91/41/ A few minutes ago before the phone call, BP was 112/52. He stated a year ago in 2017 his Lisinopril was decreased in half and that seemed to help him tremendously. He wants to know if his medications can be adjusted again. I told pt I would let Dr. Irish Lack know about his concern and someone would get back to pt with the doctor's recommendations.  Pt thanked me for my call. Marland Kitchen

## 2016-07-17 NOTE — Telephone Encounter (Signed)
Called patient and made him aware that Dr. Irish Lack would like for him to stop his lisinopril. Patient advised to monitor his BP and let us know if he has any further issues. Patient verbalized understanding and thanked me for the call.

## 2016-07-17 NOTE — Telephone Encounter (Signed)
Follow up  Pt voiced he is calling back awaiting still on the nurses call.  Please f/u

## 2016-07-17 NOTE — Telephone Encounter (Signed)
Stop lisinopril due to hypotension.

## 2016-07-17 NOTE — Telephone Encounter (Signed)
Returned call to patient. Patient states that he has had intermittent episodes of low BP since February. He states that these episodes can happen at rest and with activity. He states that this past week it has gotten more frequent with three episodes of BPs 90s/40s with HR 60-70s. He states that the worst episode was this past Friday when he was mowing the grass. He states that he felt very weak and fatigued and could barely make it back inside. This episode resolved with rest.  He states that after he made it back inside that his BP was 91/41 with HR 64.He denies any lightheadedness, dizziness, chest pain, SOB, or any other symptoms. He states that he just feels weak. Patient's BP yesterday was 112/51 and this morning BP was 111/53 HR 70. Patient has not had any issues since this past Friday. Patient states that he is well hydrated and changes positions slowly. Patient has a history of Afib. Patient states that he is currently taking lisinopril 5 mg daily and verapamil 240 mg daily. Patient states that the last time that he had issues with his BP being low that his lisinopril was decreased and that helped alot. Made patient aware that the information would be forwarded to MD for review and recommendations. Patient verbalized understanding and appreciated the call.

## 2016-07-22 ENCOUNTER — Ambulatory Visit (INDEPENDENT_AMBULATORY_CARE_PROVIDER_SITE_OTHER): Payer: Medicare Other | Admitting: *Deleted

## 2016-07-22 DIAGNOSIS — R001 Bradycardia, unspecified: Secondary | ICD-10-CM

## 2016-07-22 NOTE — Progress Notes (Signed)
Remote pacemaker transmission.   

## 2016-07-26 LAB — CUP PACEART REMOTE DEVICE CHECK
Battery Remaining Longevity: 84 mo
Implantable Pulse Generator Implant Date: 20150805
Lead Channel Pacing Threshold Pulse Width: 0.4 ms
Lead Channel Sensing Intrinsic Amplitude: 10.4 mV
Lead Channel Setting Pacing Pulse Width: 0.4 ms
Lead Channel Setting Sensing Sensitivity: 2.5 mV
MDC IDC LEAD IMPLANT DT: 20150805
MDC IDC LEAD LOCATION: 753860
MDC IDC LEAD SERIAL: 29478499
MDC IDC MSMT LEADCHNL RV IMPEDANCE VALUE: 829 Ohm
MDC IDC MSMT LEADCHNL RV PACING THRESHOLD AMPLITUDE: 1.1 V
MDC IDC SESS DTM: 20180608084227
MDC IDC SET LEADCHNL RV PACING AMPLITUDE: 1.5 V
MDC IDC STAT BRADY RV PERCENT PACED: 97 %
Pulse Gen Serial Number: 119862

## 2016-07-30 ENCOUNTER — Encounter: Payer: Self-pay | Admitting: Cardiology

## 2016-10-08 ENCOUNTER — Encounter (INDEPENDENT_AMBULATORY_CARE_PROVIDER_SITE_OTHER): Payer: Self-pay

## 2016-10-08 ENCOUNTER — Ambulatory Visit (INDEPENDENT_AMBULATORY_CARE_PROVIDER_SITE_OTHER): Payer: Medicare Other | Admitting: Internal Medicine

## 2016-10-08 ENCOUNTER — Encounter: Payer: Self-pay | Admitting: Internal Medicine

## 2016-10-08 VITALS — BP 136/62 | HR 62 | Ht 73.0 in | Wt 264.8 lb

## 2016-10-08 DIAGNOSIS — I482 Chronic atrial fibrillation, unspecified: Secondary | ICD-10-CM

## 2016-10-08 DIAGNOSIS — R001 Bradycardia, unspecified: Secondary | ICD-10-CM

## 2016-10-08 DIAGNOSIS — Z950 Presence of cardiac pacemaker: Secondary | ICD-10-CM | POA: Diagnosis not present

## 2016-10-08 NOTE — Progress Notes (Signed)
HPI Mr. Alberta returns today for follow-up. He is a very pleasant 76 year old man with a history of chronic atrial fibrillation and complete heart block, status post permanent pacemaker insertion. In the interim he has had an episode of influenza complicated by pneumonia for which she recovered completely. He denies chest pain or peripheral edema. He does have dyspnea with exertion which is really unchanged. He remains active. His weight is been stable. No syncope. No angina. No Known Allergies   Current Outpatient Prescriptions  Medication Sig Dispense Refill  . atorvastatin (LIPITOR) 10 MG tablet Take 10 mg by mouth daily with supper.   2  . fluticasone (FLONASE) 50 MCG/ACT nasal spray Place 1 spray into both nostrils 2 (two) times daily as needed for allergies.    Marland Kitchen glipiZIDE (GLUCOTROL) 10 MG tablet Take 5 mg by mouth 2 (two) times daily before a meal.     . metFORMIN (GLUCOPHAGE) 500 MG tablet Take 1,000 mg by mouth 2 (two) times daily with a meal.     . Multiple Vitamin (MULTIVITAMIN WITH MINERALS) TABS tablet Take 1 tablet by mouth daily with supper.     . Omega-3 Fatty Acids (FISH OIL) 1000 MG CAPS Take 2,000 mg by mouth 2 (two) times daily.     . verapamil (CALAN-SR) 240 MG CR tablet Take 1 tablet (240 mg total) by mouth at bedtime. 90 tablet 3  . warfarin (COUMADIN) 10 MG tablet 5-10 mg daily at 6 PM. He takes one tablet everyday except on Wednesdays. He takes half a tablet on that one day.     No current facility-administered medications for this visit.      Past Medical History:  Diagnosis Date  . Adenomatous colon polyp   . Arthritis    hands, knees  . Atrial fibrillation ([place])    failed cardioversion and passed, rate control and anticoagulation   . Diabetes mellitus without complication ([place])   . Dysrhythmia    Chronic Atrial Fibrillation- in and out  . ED (erectile dysfunction)    does not desire treatment  . Hyperlipidemia   . Hypertension   . Obesity,  unspecified   . Unspecified sleep apnea   . Venous insufficiency    with lower extremity venous varicosities    ROS:   All systems reviewed and negative except as noted in the HPI.   Past Surgical History:  Procedure Laterality Date  . COLONOSCOPY WITH PROPOFOL N/A 07/03/2015   Procedure: COLONOSCOPY WITH PROPOFOL;  Surgeon: Garlan Fair, MD;  Location: [facility];  Service: Endoscopy;  Laterality: N/A;  . HERNIA REPAIR     inguinal  . PACEMAKER INSERTION  09-22-2013   BSX single chamber pacemaker implanted by Dr Lovena Le for symptomatic bradycardia  . PERMANENT PACEMAKER INSERTION N/A 09/22/2013   Procedure: PERMANENT PACEMAKER INSERTION;  Surgeon: Evans Lance, MD;  [facility] C[facility] LAB;  Service: Cardiovascular;  Laterality: N/A;  . VARICOSE VEIN SURGERY Bilateral      Family History  Problem Relation Age of Onset  . CAD Mother   . Breast cancer Mother   . Diabetes Mother   . CAD Father   . Prostate cancer Father   . Heart failure Brother   . CAD Brother      Social History   Social History  . Marital status: Married    Spouse name: N/A  . Number of children: N/A  . Years of education: N/A   Occupational History  . Not  on file.   Social History Main Topics  . Smoking status: Former Smoker    Quit date: 06/25/1985  . Smokeless tobacco: Never Used  . Alcohol use No  . Drug use: No  . Sexual activity: Not on file   Other Topics Concern  . Not on file   Social History Narrative  . No narrative on file     BP 136/62   Pulse 62   Ht 6\' 1"  (1.854 m)   Wt 264 lb 12.8 oz (120.1 kg)   BMI 34.94 kg/m   Physical Exam:  Well appearing 76 year old man, NAD HEENT: Unremarkable Neck:  7 cm JVD, no thyromegally Lymphatics:  No adenopathy Back:  No CVA tenderness Lungs:  Clear, with no wheezes, rales, or rhonchi. Well-healed pacemaker incision. HEART:  Regular rate rhythm, no murmurs, no rubs, no clicks Abd:  soft, positive bowel sounds, no  organomegally, no rebound, no guarding Ext:  2 plus pulses, trace peripheral edema, no cyanosis, no clubbing Skin:  No rashes no nodules Neuro:  CN II through XII intact, motor grossly intact  EKG - none today  DEVICE  Normal device function.  See PaceArt for details.   Assess/Plan: 1. Complete heart block - he is stable status post [employer] single-chamber pacemaker insertion. 2. Chronic atrial fibrillation - his ventricular rate is well controlled. He will continue systemic anticoagulation. 3. Hypertensive heart disease - he is doing well with well-controlled blood pressures. He actually stopped taking his ACE inhibitor secondary to hypotension. He is encouraged to maintain a low-sodium diet.  Cristopher Peru, M.D.

## 2016-10-08 NOTE — Patient Instructions (Signed)
Medication Instructions:  Your physician recommends that you continue on your current medications as directed. Please refer to the Current Medication list given to you today.  Labwork: None ordered.  Testing/Procedures: None ordered.  Follow-Up: Your physician wants you to follow-up in: one year with Dr. Lovena Le.   You will receive a reminder letter in the mail two months in advance. If you don't receive a letter, please call our office to schedule the follow-up appointment.  Remote monitoring is used to monitor your Pacemaker from home. This monitoring reduces the number of office visits required to check your device to one time per year. It allows [place] to keep an eye on the functioning of your device to ensure it is working properly. You are scheduled for a device check from home on 10/22/2016. You may send your transmission at any time that day. If you have a wireless device, the transmission will be sent automatically. After your physician reviews your transmission, you will receive a postcard with your next transmission date.    Any Other Special Instructions Will Be Listed Below (If Applicable).     If you need a refill on your cardiac medications before your next appointment, please call your pharmacy.

## 2016-10-22 ENCOUNTER — Ambulatory Visit (INDEPENDENT_AMBULATORY_CARE_PROVIDER_SITE_OTHER): Payer: Medicare Other | Admitting: *Deleted

## 2016-10-22 DIAGNOSIS — R001 Bradycardia, unspecified: Secondary | ICD-10-CM | POA: Diagnosis not present

## 2016-10-23 NOTE — Progress Notes (Signed)
Remote pacemaker transmission.   

## 2016-10-30 ENCOUNTER — Encounter: Payer: Self-pay | Admitting: Cardiology

## 2016-11-01 LAB — CUP PACEART REMOTE DEVICE CHECK
Battery Remaining Percentage: 95 %
Date Time Interrogation Session: 20180904072700
Implantable Lead Implant Date: 20150805
Implantable Lead Location: 753860
Implantable Lead Serial Number: 29478499
Implantable Pulse Generator Implant Date: 20150805
Lead Channel Impedance Value: 803 Ohm
Lead Channel Pacing Threshold Pulse Width: 0.4 ms
Lead Channel Setting Pacing Amplitude: 1.6 V
MDC IDC MSMT BATTERY REMAINING LONGEVITY: 84 mo
MDC IDC MSMT LEADCHNL RV PACING THRESHOLD AMPLITUDE: 1.1 V
MDC IDC SET LEADCHNL RV PACING PULSEWIDTH: 0.4 ms
MDC IDC SET LEADCHNL RV SENSING SENSITIVITY: 2.5 mV
MDC IDC STAT BRADY RV PERCENT PACED: 100 %
Pulse Gen Serial Number: 119862

## 2017-01-20 ENCOUNTER — Other Ambulatory Visit: Payer: Self-pay

## 2017-01-20 MED ORDER — VERAPAMIL HCL ER 240 MG PO TBCR: 240.0000 mg | EXTENDED_RELEASE_TABLET | Freq: Every day | ORAL | 2 refills | Status: DC

## 2017-01-21 ENCOUNTER — Ambulatory Visit (INDEPENDENT_AMBULATORY_CARE_PROVIDER_SITE_OTHER): Payer: Medicare Other | Admitting: *Deleted

## 2017-01-21 DIAGNOSIS — R001 Bradycardia, unspecified: Secondary | ICD-10-CM

## 2017-01-21 LAB — CUP PACEART REMOTE DEVICE CHECK
Battery Remaining Longevity: 78 mo
Battery Remaining Percentage: 92 %
Brady Statistic RV Percent Paced: 100 %
Implantable Lead Implant Date: 20150805
Implantable Lead Location: 753860
Implantable Lead Model: 4137
Implantable Lead Serial Number: 29478499
Lead Channel Pacing Threshold Amplitude: 1.1 V
Lead Channel Pacing Threshold Pulse Width: 0.4 ms
Lead Channel Setting Sensing Sensitivity: 2.5 mV
MDC IDC MSMT LEADCHNL RV IMPEDANCE VALUE: 787 Ohm
MDC IDC PG IMPLANT DT: 20150805
MDC IDC PG SERIAL: 119862
MDC IDC SESS DTM: 20181204080100
MDC IDC SET LEADCHNL RV PACING AMPLITUDE: 1.6 V
MDC IDC SET LEADCHNL RV PACING PULSEWIDTH: 0.4 ms

## 2017-01-21 NOTE — Progress Notes (Signed)
Remote pacemaker transmission.   

## 2017-01-22 ENCOUNTER — Encounter: Payer: Self-pay | Admitting: Cardiology

## 2017-02-21 ENCOUNTER — Other Ambulatory Visit: Payer: Self-pay | Admitting: Interventional Cardiology

## 2017-02-21 MED ORDER — VERAPAMIL HCL ER 240 MG PO TBCR: 240.0000 mg | EXTENDED_RELEASE_TABLET | Freq: Every day | ORAL | 0 refills | Status: DC

## 2017-02-25 ENCOUNTER — Other Ambulatory Visit: Payer: Self-pay | Admitting: Interventional Cardiology

## 2017-02-25 MED ORDER — VERAPAMIL HCL ER 240 MG PO TBCR: 240.0000 mg | EXTENDED_RELEASE_TABLET | Freq: Every day | ORAL | 2 refills | Status: DC

## 2017-02-26 DIAGNOSIS — B351 Tinea unguium: Secondary | ICD-10-CM | POA: Diagnosis not present

## 2017-02-26 DIAGNOSIS — E139 Other specified diabetes mellitus without complications: Secondary | ICD-10-CM | POA: Diagnosis not present

## 2017-03-05 DIAGNOSIS — M2550 Pain in unspecified joint: Secondary | ICD-10-CM | POA: Diagnosis not present

## 2017-03-05 DIAGNOSIS — Z79899 Other long term (current) drug therapy: Secondary | ICD-10-CM | POA: Diagnosis not present

## 2017-03-05 DIAGNOSIS — M150 Primary generalized (osteo)arthritis: Secondary | ICD-10-CM | POA: Diagnosis not present

## 2017-03-05 DIAGNOSIS — Z6835 Body mass index (BMI) 35.0-35.9, adult: Secondary | ICD-10-CM | POA: Diagnosis not present

## 2017-03-05 DIAGNOSIS — E669 Obesity, unspecified: Secondary | ICD-10-CM | POA: Diagnosis not present

## 2017-03-05 DIAGNOSIS — M154 Erosive (osteo)arthritis: Secondary | ICD-10-CM | POA: Diagnosis not present

## 2017-03-12 DIAGNOSIS — E119 Type 2 diabetes mellitus without complications: Secondary | ICD-10-CM | POA: Diagnosis not present

## 2017-03-12 DIAGNOSIS — I482 Chronic atrial fibrillation: Secondary | ICD-10-CM | POA: Diagnosis not present

## 2017-03-12 DIAGNOSIS — E782 Mixed hyperlipidemia: Secondary | ICD-10-CM | POA: Diagnosis not present

## 2017-03-12 DIAGNOSIS — M154 Erosive (osteo)arthritis: Secondary | ICD-10-CM | POA: Diagnosis not present

## 2017-03-12 DIAGNOSIS — Z7984 Long term (current) use of oral hypoglycemic drugs: Secondary | ICD-10-CM | POA: Diagnosis not present

## 2017-03-18 DIAGNOSIS — Z7901 Long term (current) use of anticoagulants: Secondary | ICD-10-CM | POA: Diagnosis not present

## 2017-04-15 DIAGNOSIS — Z7901 Long term (current) use of anticoagulants: Secondary | ICD-10-CM | POA: Diagnosis not present

## 2017-04-17 DIAGNOSIS — R69 Illness, unspecified: Secondary | ICD-10-CM | POA: Diagnosis not present

## 2017-04-17 DIAGNOSIS — G4733 Obstructive sleep apnea (adult) (pediatric): Secondary | ICD-10-CM | POA: Diagnosis not present

## 2017-04-22 ENCOUNTER — Ambulatory Visit (INDEPENDENT_AMBULATORY_CARE_PROVIDER_SITE_OTHER): Payer: Medicare HMO | Admitting: *Deleted

## 2017-04-22 DIAGNOSIS — R001 Bradycardia, unspecified: Secondary | ICD-10-CM | POA: Diagnosis not present

## 2017-04-22 NOTE — Progress Notes (Signed)
Remote pacemaker transmission.   

## 2017-04-23 ENCOUNTER — Encounter: Payer: Self-pay | Admitting: Cardiology

## 2017-04-24 LAB — CUP PACEART REMOTE DEVICE CHECK
Battery Remaining Percentage: 88 %
Date Time Interrogation Session: 20190305080100
Implantable Lead Location: 753860
Implantable Lead Serial Number: 29478499
Implantable Pulse Generator Implant Date: 20150805
Lead Channel Pacing Threshold Amplitude: 1 V
Lead Channel Pacing Threshold Pulse Width: 0.4 ms
Lead Channel Setting Pacing Amplitude: 1.5 V
Lead Channel Setting Pacing Pulse Width: 0.4 ms
MDC IDC LEAD IMPLANT DT: 20150805
MDC IDC MSMT BATTERY REMAINING LONGEVITY: 78 mo
MDC IDC MSMT LEADCHNL RV IMPEDANCE VALUE: 745 Ohm
MDC IDC SET LEADCHNL RV SENSING SENSITIVITY: 2.5 mV
MDC IDC STAT BRADY RV PERCENT PACED: 100 %
Pulse Gen Serial Number: 119862

## 2017-05-05 NOTE — Progress Notes (Signed)
Cardiology Office Note   Date:  05/06/2017   ID:  Patrick Lozano, DOB 03-02-1940, MRN [id number]  PCP:  Lavone Orn, MD    No chief complaint on file.  AFib  Wt Readings from Last 3 Encounters:  05/06/17 262 lb (118.8 kg)  10/08/16 264 lb 12.8 oz (120.1 kg)  11/01/15 264 lb (119.7 kg)       History of Present Illness: Patrick Lozano is a 77 y.o. male  who has atrial fibrillation. He had a procedure for his varicose veins a few years ago. His legs feels somewhat better now. Pacer placed in August 2015 for symptomatic bradycardia. Felt much better after this.   In the past, BP readings have been low at home at times after mowing the lawn.  No syncope or lightheadedness reported, since pacer was placed.  BP was low after mowing the lawn so lisinopril was stopped.   [place]    Denies : Chest pain. Dizziness. Leg edema. Nitroglycerin use. Orthopnea. Palpitations. Paroxysmal nocturnal dyspnea. Shortness of breath. Syncope.   No bleeding problems.  No sx from the AFib.   Uses a [place], but knee pain limits him.  He saw rheumatologist and has osteoarthritis.  Gets benefit from CBD oil on his knuckles.      Past Medical History:  Diagnosis Date  . Adenomatous colon polyp   . Arthritis    hands, knees  . Atrial fibrillation ([place])    failed cardioversion and passed, rate control and anticoagulation   . Diabetes mellitus without complication ([place])   . Dysrhythmia    Chronic Atrial Fibrillation- in and out  . ED (erectile dysfunction)    does not desire treatment  . Hyperlipidemia   . Hypertension   . Obesity, unspecified   . Unspecified sleep apnea   . Venous insufficiency    with lower extremity venous varicosities    Past Surgical History:  Procedure Laterality Date  . COLONOSCOPY WITH PROPOFOL N/A 07/03/2015   Procedure: COLONOSCOPY WITH PROPOFOL;  Surgeon: Garlan Fair, MD;  Location: [facility];  Service: Endoscopy;  Laterality: N/A;   . HERNIA REPAIR     inguinal  . PACEMAKER INSERTION  09-22-2013   BSX single chamber pacemaker implanted by Dr Lovena Le for symptomatic bradycardia  . PERMANENT PACEMAKER INSERTION N/A 09/22/2013   Procedure: PERMANENT PACEMAKER INSERTION;  Surgeon: Evans Lance, MD;  Location: [facility] CATH LAB;  Service: Cardiovascular;  Laterality: N/A;  . VARICOSE VEIN SURGERY Bilateral      Current Outpatient Medications  Medication Sig Dispense Refill  . atorvastatin (LIPITOR) 10 MG tablet Take 10 mg by mouth daily with supper.   2  . fluticasone (FLONASE) 50 MCG/ACT nasal spray Place 1 spray into both nostrils 2 (two) times daily as needed for allergies.    Marland Kitchen glipiZIDE (GLUCOTROL) 10 MG tablet Take 5 mg by mouth 2 (two) times daily before a meal.     . hydroxychloroquine (PLAQUENIL) 200 MG tablet Take 200 mg by mouth 2 (two) times daily.  1  . metFORMIN (GLUCOPHAGE) 500 MG tablet Take 1,000 mg by mouth 2 (two) times daily with a meal.     . Multiple Vitamin (MULTIVITAMIN WITH MINERALS) TABS tablet Take 1 tablet by mouth daily with supper.     . Omega-3 Fatty Acids (FISH OIL) 1000 MG CAPS Take 2,000 mg by mouth 2 (two) times daily.     . verapamil (CALAN-SR) 240 MG CR tablet Take 1  tablet (240 mg total) by mouth at bedtime. 90 tablet 2  . warfarin (COUMADIN) 10 MG tablet 5-10 mg daily at 6 PM. He takes one tablet everyday except on Wednesdays. He takes half a tablet on that one day.     No current facility-administered medications for this visit.     Allergies:   Patient has no known allergies.    Social History:  The patient  reports that he quit smoking about 31 years ago. he has never used smokeless tobacco. He reports that he does not drink alcohol or use drugs.   Family History:  The patient's family history includes Breast cancer in his mother; CAD in his brother, father, and mother; Diabetes in his mother; Heart failure in his brother; Prostate cancer in his father.    ROS:  Please see the  history of present illness.   Otherwise, review of systems are positive for knee pain.   All other systems are reviewed and negative.    PHYSICAL EXAM: VS:  BP 116/62   Pulse 60   Ht 6\' 1"  (1.854 m)   Wt 262 lb (118.8 kg)   SpO2 97%   BMI 34.57 kg/m  , BMI Body mass index is 34.57 kg/m. GEN: Well nourished, well developed, in no acute distress  HEENT: normal  Neck: no JVD, carotid bruits, or masses Cardiac: RRR; no murmurs, rubs, or gallops,;tr bilateral leg edema  Respiratory:  clear to auscultation bilaterally, normal work of breathing GI: soft, nontender, nondistended, + BS MS: no deformity or atrophy  Skin: warm and dry, no rash Neuro:  Strength and sensation are intact Psych: euthymic mood, full affect   EKG:   The ekg ordered today demonstrates AFib, V-paced rhythm.   Recent Labs: No results found for requested labs within last 8760 hours.   Lipid Panel    Component Value Date/Time   CHOL 119 11/18/2013 1022   TRIG 98.0 11/18/2013 1022   HDL 32.70 (L) 11/18/2013 1022   CHOLHDL 4 11/18/2013 1022   VLDL 19.6 11/18/2013 1022   LDLCALC 67 11/18/2013 1022     Other studies Reviewed: Additional studies/ records that were reviewed today with results demonstrating: Old ECG from 2017 sowed AFib and wide QRS.Marland Kitchen   ASSESSMENT AND PLAN:  1. AFib: Coumadin for stroke prevention.  Adequately rate controlled.   INR 2.1 today. 2. Hyperlipidemia:  LDL 58 in May 2018.  To be rechecked with Dr. Laurann Montana. 3. DM: A1C 7.3. Continue healthy diet.  Try increasing exercise to get 150 min/week.   4. Pacer: remote pacer checks and f/u With Dr. Lovena Le. Bradycardia resolved. 5. HTN: The current medical regimen is effective;  continue present plan and medications.    Current medicines are reviewed at length with the patient today.  The patient concerns regarding his medicines were addressed.  The following changes have been made:  No change  Labs/ tests ordered today include:  No  orders of the defined types were placed in this encounter.   Recommend 150 minutes/week of aerobic exercise Low fat, low carb, high fiber diet recommended  Disposition:   FU in 1 year   Signed, Larae Grooms, MD  05/06/2017 2:03 PM    [facility] [place], [place], [place]  [address] Phone: [phone]; Fax: [phone]

## 2017-05-06 ENCOUNTER — Encounter: Payer: Self-pay | Admitting: Interventional Cardiology

## 2017-05-06 ENCOUNTER — Ambulatory Visit: Payer: Medicare HMO | Admitting: Interventional Cardiology

## 2017-05-06 VITALS — BP 116/62 | HR 60 | Ht 73.0 in | Wt 262.0 lb

## 2017-05-06 DIAGNOSIS — E119 Type 2 diabetes mellitus without complications: Secondary | ICD-10-CM | POA: Diagnosis not present

## 2017-05-06 DIAGNOSIS — Z7901 Long term (current) use of anticoagulants: Secondary | ICD-10-CM | POA: Diagnosis not present

## 2017-05-06 DIAGNOSIS — Z950 Presence of cardiac pacemaker: Secondary | ICD-10-CM

## 2017-05-06 DIAGNOSIS — R001 Bradycardia, unspecified: Secondary | ICD-10-CM | POA: Diagnosis not present

## 2017-05-06 DIAGNOSIS — E782 Mixed hyperlipidemia: Secondary | ICD-10-CM

## 2017-05-06 DIAGNOSIS — I482 Chronic atrial fibrillation, unspecified: Secondary | ICD-10-CM

## 2017-05-06 DIAGNOSIS — I10 Essential (primary) hypertension: Secondary | ICD-10-CM | POA: Diagnosis not present

## 2017-05-06 NOTE — Patient Instructions (Signed)

## 2017-05-07 DIAGNOSIS — M79672 Pain in left foot: Secondary | ICD-10-CM | POA: Diagnosis not present

## 2017-05-07 DIAGNOSIS — B351 Tinea unguium: Secondary | ICD-10-CM | POA: Diagnosis not present

## 2017-05-07 DIAGNOSIS — M79671 Pain in right foot: Secondary | ICD-10-CM | POA: Diagnosis not present

## 2017-05-07 DIAGNOSIS — E139 Other specified diabetes mellitus without complications: Secondary | ICD-10-CM | POA: Diagnosis not present

## 2017-06-02 DIAGNOSIS — E1165 Type 2 diabetes mellitus with hyperglycemia: Secondary | ICD-10-CM | POA: Diagnosis not present

## 2017-06-02 DIAGNOSIS — M1990 Unspecified osteoarthritis, unspecified site: Secondary | ICD-10-CM | POA: Diagnosis not present

## 2017-06-02 DIAGNOSIS — G8929 Other chronic pain: Secondary | ICD-10-CM | POA: Diagnosis not present

## 2017-06-02 DIAGNOSIS — E785 Hyperlipidemia, unspecified: Secondary | ICD-10-CM | POA: Diagnosis not present

## 2017-06-02 DIAGNOSIS — Z7901 Long term (current) use of anticoagulants: Secondary | ICD-10-CM | POA: Diagnosis not present

## 2017-06-02 DIAGNOSIS — I4891 Unspecified atrial fibrillation: Secondary | ICD-10-CM | POA: Diagnosis not present

## 2017-06-02 DIAGNOSIS — J309 Allergic rhinitis, unspecified: Secondary | ICD-10-CM | POA: Diagnosis not present

## 2017-06-02 DIAGNOSIS — Z7984 Long term (current) use of oral hypoglycemic drugs: Secondary | ICD-10-CM | POA: Diagnosis not present

## 2017-06-02 DIAGNOSIS — E669 Obesity, unspecified: Secondary | ICD-10-CM | POA: Diagnosis not present

## 2017-06-02 DIAGNOSIS — Z6834 Body mass index (BMI) 34.0-34.9, adult: Secondary | ICD-10-CM | POA: Diagnosis not present

## 2017-06-03 DIAGNOSIS — Z7901 Long term (current) use of anticoagulants: Secondary | ICD-10-CM | POA: Diagnosis not present

## 2017-06-22 IMAGING — CR DG HAND COMPLETE 3+V*L*
3 series · 3 of 3 positions shown · non-contrast
Comparison: None.

CLINICAL DATA: Chronic pain in the left hand. Swelling in the
joints. Loss of grip. No injury.

EXAM:
LEFT HAND - COMPLETE 3+ VIEW

[x hand pa left]
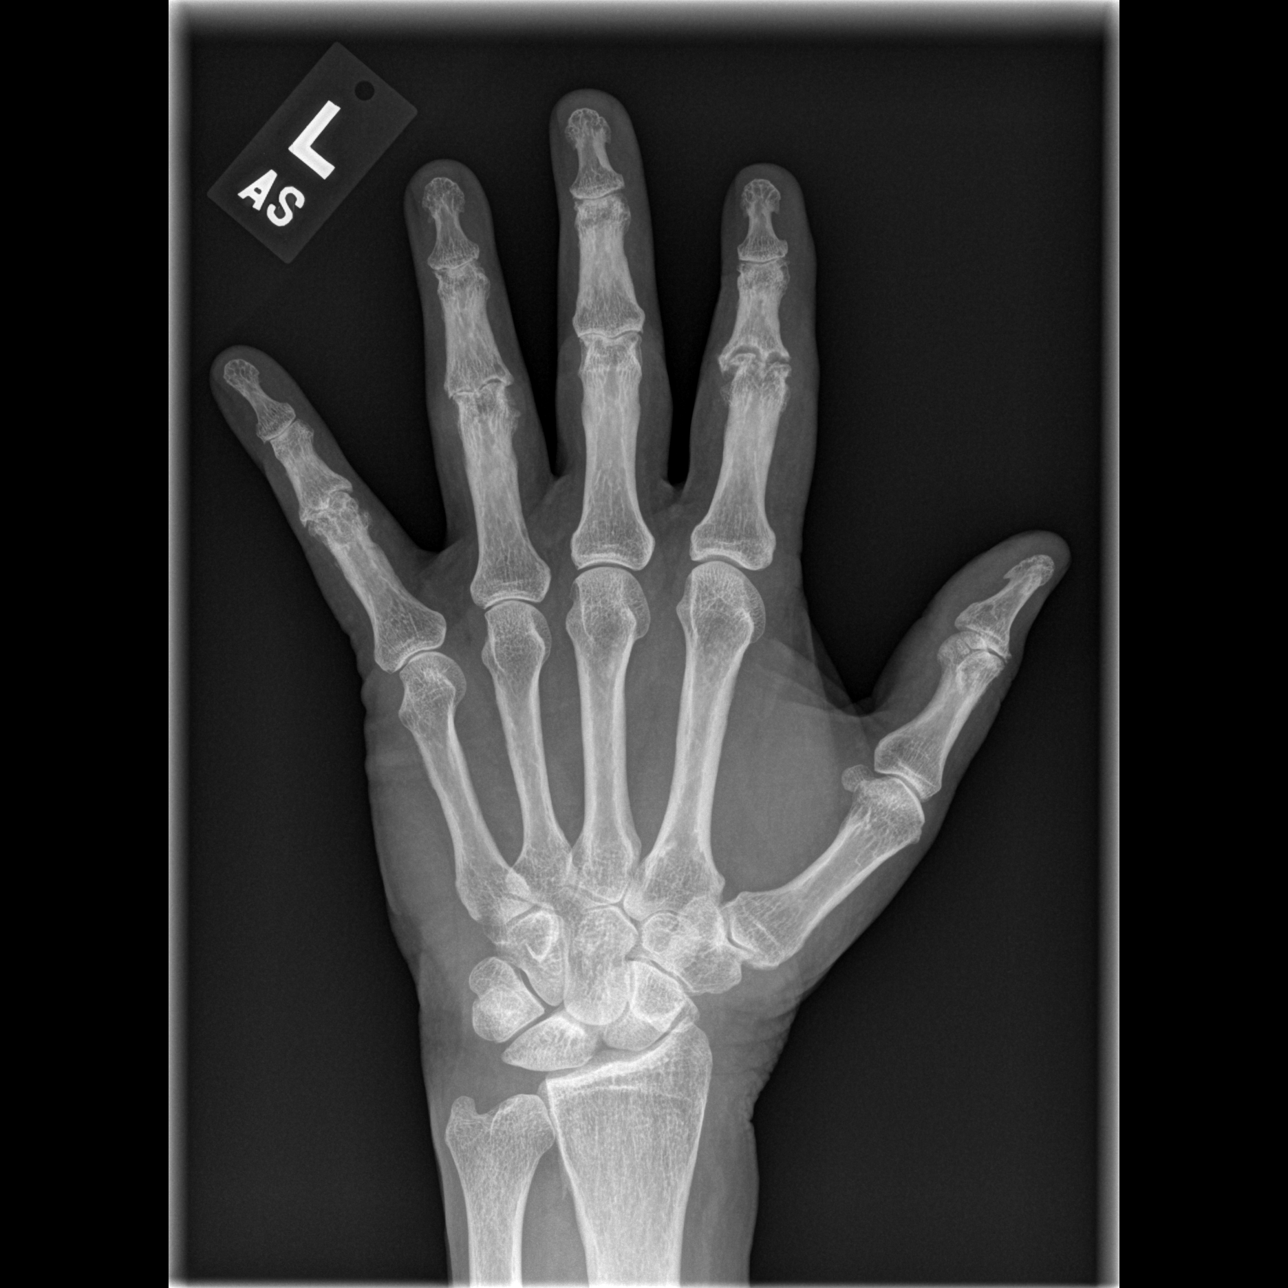

[x hand oblique left]
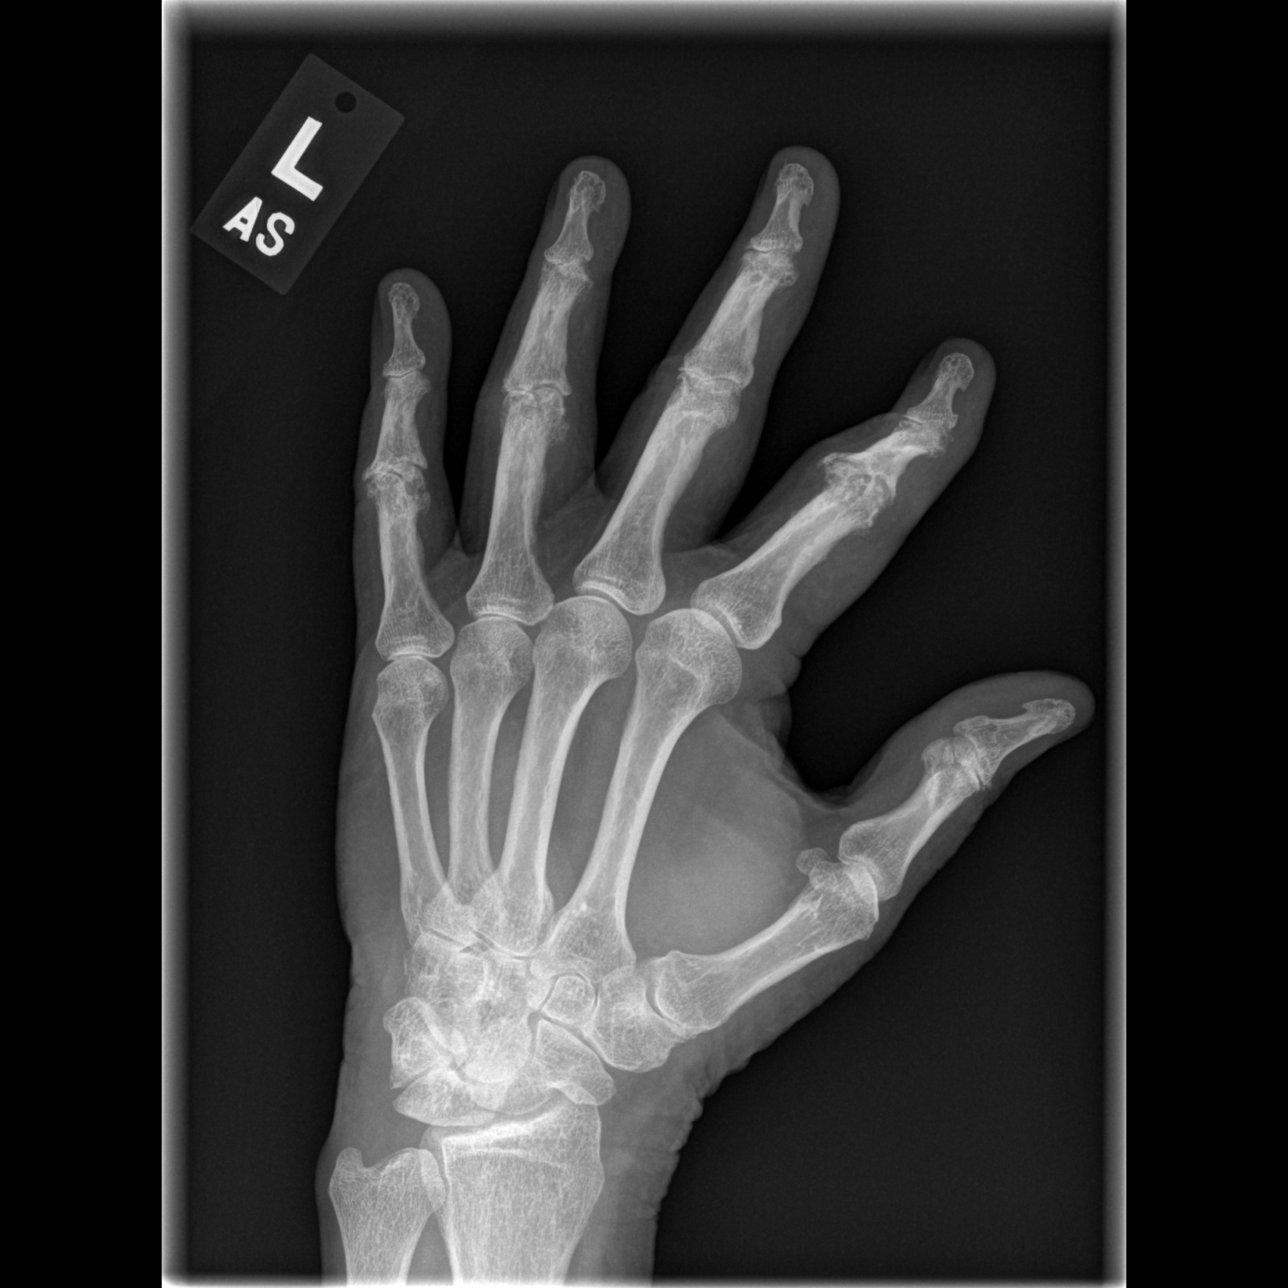

[x hand lat left]
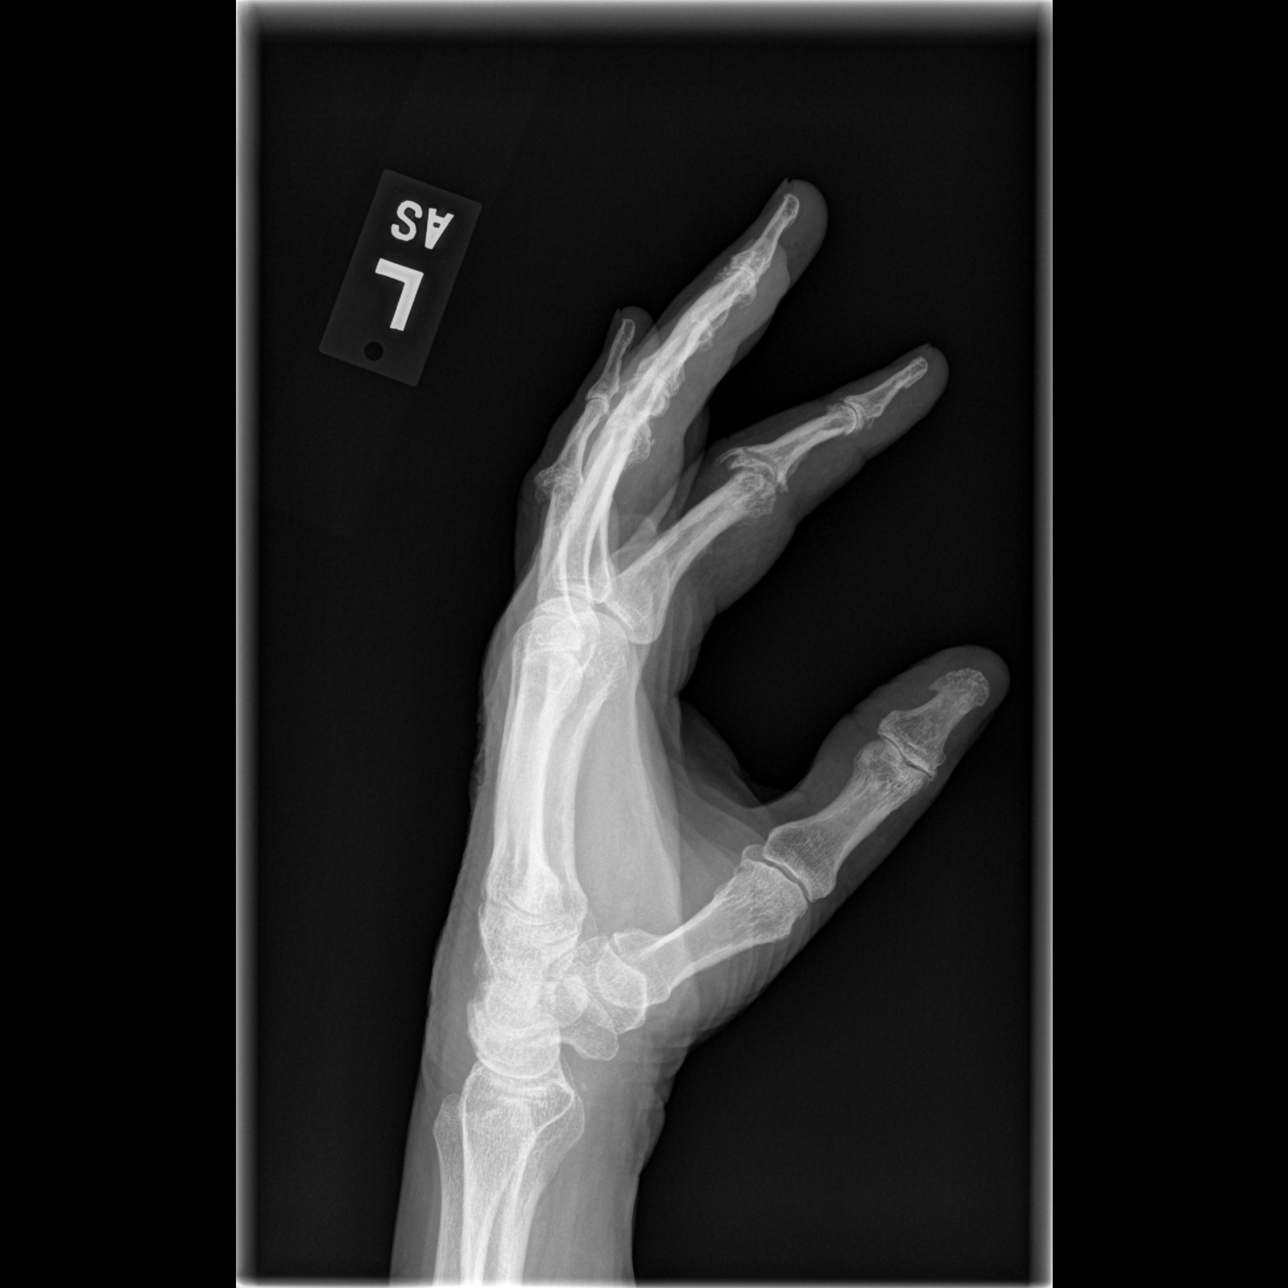

[3 of 3 positions shown; findings below may reference images not displayed]

FINDINGS: No fracture or dislocation.

There are significant arthropathic changes. There is asymmetric
joint space narrowing of the interphalangeal joints, PIP joints
greater than the DIP joints, most severely involving the PIP joint
of the index finger. There are subchondral erosions that are most
prominent involving the PIP joint of the index finger.

There is mild narrowing of the MCP joints, greatest of the fourth
and fifth, with no spurring or erosions.

The intercarpal articulations are unremarkable.

Soft tissue swelling is noted at several joints most evident
adjacent to the PIP joint of the index finger. No soft tissue
ossification or calcification.
IMPRESSION: 1. No fracture or dislocation.
2. There are advanced arthropathic changes have predominantly
involve the interphalangeal joints, PIP joints greater than DIP
joints. There is an erosive component that is most evident at the
PIP joint of the index finger where there is the greatest degree of
adjacent soft tissue swelling. Findings are consistent with erosive
osteoarthritis.

## 2017-06-26 DIAGNOSIS — R0609 Other forms of dyspnea: Secondary | ICD-10-CM | POA: Diagnosis not present

## 2017-06-26 DIAGNOSIS — Z7901 Long term (current) use of anticoagulants: Secondary | ICD-10-CM | POA: Diagnosis not present

## 2017-06-26 DIAGNOSIS — Z7189 Other specified counseling: Secondary | ICD-10-CM | POA: Diagnosis not present

## 2017-06-26 DIAGNOSIS — E669 Obesity, unspecified: Secondary | ICD-10-CM | POA: Diagnosis not present

## 2017-06-26 DIAGNOSIS — Z1389 Encounter for screening for other disorder: Secondary | ICD-10-CM | POA: Diagnosis not present

## 2017-06-26 DIAGNOSIS — M154 Erosive (osteo)arthritis: Secondary | ICD-10-CM | POA: Diagnosis not present

## 2017-06-26 DIAGNOSIS — E0869 Diabetes mellitus due to underlying condition with other specified complication: Secondary | ICD-10-CM | POA: Diagnosis not present

## 2017-06-26 DIAGNOSIS — Z23 Encounter for immunization: Secondary | ICD-10-CM | POA: Diagnosis not present

## 2017-06-26 DIAGNOSIS — I482 Chronic atrial fibrillation: Secondary | ICD-10-CM | POA: Diagnosis not present

## 2017-06-26 DIAGNOSIS — G4733 Obstructive sleep apnea (adult) (pediatric): Secondary | ICD-10-CM | POA: Diagnosis not present

## 2017-06-26 DIAGNOSIS — E1169 Type 2 diabetes mellitus with other specified complication: Secondary | ICD-10-CM | POA: Diagnosis not present

## 2017-06-26 DIAGNOSIS — I442 Atrioventricular block, complete: Secondary | ICD-10-CM | POA: Diagnosis not present

## 2017-06-26 DIAGNOSIS — E782 Mixed hyperlipidemia: Secondary | ICD-10-CM | POA: Diagnosis not present

## 2017-06-26 DIAGNOSIS — Z0000 Encounter for general adult medical examination without abnormal findings: Secondary | ICD-10-CM | POA: Diagnosis not present

## 2017-07-01 DIAGNOSIS — E119 Type 2 diabetes mellitus without complications: Secondary | ICD-10-CM | POA: Diagnosis not present

## 2017-07-01 DIAGNOSIS — H5203 Hypermetropia, bilateral: Secondary | ICD-10-CM | POA: Diagnosis not present

## 2017-07-01 DIAGNOSIS — Z961 Presence of intraocular lens: Secondary | ICD-10-CM | POA: Diagnosis not present

## 2017-07-01 DIAGNOSIS — H524 Presbyopia: Secondary | ICD-10-CM | POA: Diagnosis not present

## 2017-07-01 DIAGNOSIS — H52203 Unspecified astigmatism, bilateral: Secondary | ICD-10-CM | POA: Diagnosis not present

## 2017-07-16 DIAGNOSIS — B351 Tinea unguium: Secondary | ICD-10-CM | POA: Diagnosis not present

## 2017-07-16 DIAGNOSIS — E139 Other specified diabetes mellitus without complications: Secondary | ICD-10-CM | POA: Diagnosis not present

## 2017-07-17 DIAGNOSIS — R0609 Other forms of dyspnea: Secondary | ICD-10-CM | POA: Diagnosis not present

## 2017-07-22 ENCOUNTER — Ambulatory Visit (INDEPENDENT_AMBULATORY_CARE_PROVIDER_SITE_OTHER): Payer: Medicare HMO | Admitting: *Deleted

## 2017-07-22 DIAGNOSIS — G4733 Obstructive sleep apnea (adult) (pediatric): Secondary | ICD-10-CM | POA: Diagnosis not present

## 2017-07-22 DIAGNOSIS — I4891 Unspecified atrial fibrillation: Secondary | ICD-10-CM | POA: Diagnosis not present

## 2017-07-22 NOTE — Progress Notes (Signed)
Remote pacemaker transmission.   

## 2017-07-24 DIAGNOSIS — Z7901 Long term (current) use of anticoagulants: Secondary | ICD-10-CM | POA: Diagnosis not present

## 2017-08-26 DIAGNOSIS — Z7901 Long term (current) use of anticoagulants: Secondary | ICD-10-CM | POA: Diagnosis not present

## 2017-09-11 LAB — CUP PACEART REMOTE DEVICE CHECK
Battery Remaining Percentage: 85 %
Brady Statistic RV Percent Paced: 100 %
Date Time Interrogation Session: 20190604070000
Implantable Lead Serial Number: 29478499
Implantable Pulse Generator Implant Date: 20150805
Lead Channel Impedance Value: 722 Ohm
Lead Channel Pacing Threshold Pulse Width: 0.4 ms
Lead Channel Setting Pacing Amplitude: 1.4 V
Lead Channel Setting Pacing Pulse Width: 0.4 ms
MDC IDC LEAD IMPLANT DT: 20150805
MDC IDC LEAD LOCATION: 753860
MDC IDC MSMT BATTERY REMAINING LONGEVITY: 72 mo
MDC IDC MSMT LEADCHNL RV PACING THRESHOLD AMPLITUDE: 0.9 V
MDC IDC SET LEADCHNL RV SENSING SENSITIVITY: 2.5 mV
Pulse Gen Serial Number: 119862

## 2017-10-07 DIAGNOSIS — B351 Tinea unguium: Secondary | ICD-10-CM | POA: Diagnosis not present

## 2017-10-07 DIAGNOSIS — E139 Other specified diabetes mellitus without complications: Secondary | ICD-10-CM | POA: Diagnosis not present

## 2017-10-07 DIAGNOSIS — L84 Corns and callosities: Secondary | ICD-10-CM | POA: Diagnosis not present

## 2017-10-07 DIAGNOSIS — Z7901 Long term (current) use of anticoagulants: Secondary | ICD-10-CM | POA: Diagnosis not present

## 2017-10-08 ENCOUNTER — Encounter: Payer: Self-pay | Admitting: Internal Medicine

## 2017-10-08 ENCOUNTER — Ambulatory Visit: Payer: Medicare HMO | Admitting: Internal Medicine

## 2017-10-08 VITALS — BP 134/64 | HR 58 | Ht 73.0 in | Wt 260.8 lb

## 2017-10-08 DIAGNOSIS — I442 Atrioventricular block, complete: Secondary | ICD-10-CM | POA: Diagnosis not present

## 2017-10-08 DIAGNOSIS — I482 Chronic atrial fibrillation, unspecified: Secondary | ICD-10-CM

## 2017-10-08 DIAGNOSIS — R5383 Other fatigue: Secondary | ICD-10-CM | POA: Diagnosis not present

## 2017-10-08 DIAGNOSIS — I10 Essential (primary) hypertension: Secondary | ICD-10-CM | POA: Diagnosis not present

## 2017-10-08 DIAGNOSIS — Z950 Presence of cardiac pacemaker: Secondary | ICD-10-CM | POA: Diagnosis not present

## 2017-10-08 DIAGNOSIS — R0602 Shortness of breath: Secondary | ICD-10-CM

## 2017-10-08 NOTE — Progress Notes (Signed)
HPI [name] returns today for follow-up. He is a very pleasant 77 year old man with a history of chronic atrial fibrillation and complete heart block, status post permanent pacemaker insertion. He has done well in the interim except he feels some increased fatigue and weakness and dsypnea with exertion, especially when walking.  Allergies  Allergen Reactions  . Lisinopril     "Very low blood pressure"     Current Outpatient Medications  Medication Sig Dispense Refill  . fluticasone (FLONASE) 50 MCG/ACT nasal spray Place 1 spray into both nostrils 2 (two) times daily as needed for allergies.    Marland Kitchen glipiZIDE (GLUCOTROL) 10 MG tablet Take 5 mg by mouth 2 (two) times daily before a meal.     . metFORMIN (GLUCOPHAGE) 500 MG tablet Take 1,000 mg by mouth 2 (two) times daily with a meal.     . Multiple Vitamin (MULTIVITAMIN WITH MINERALS) TABS tablet Take 1 tablet by mouth daily with supper.     . Omega-3 Fatty Acids (FISH OIL) 1000 MG CAPS Take 2,000 mg by mouth 2 (two) times daily.     . verapamil (CALAN-SR) 240 MG CR tablet Take 1 tablet (240 mg total) by mouth at bedtime. 90 tablet 2  . warfarin (COUMADIN) 10 MG tablet 5-10 mg daily at 6 PM. He takes one tablet everyday except on Wednesdays. He takes half a tablet on that one day.     No current facility-administered medications for this visit.      Past Medical History:  Diagnosis Date  . Adenomatous colon polyp   . Arthritis    hands, knees  . Atrial fibrillation ([place])    failed cardioversion and passed, rate control and anticoagulation   . Diabetes mellitus without complication ([place])   . Dysrhythmia    Chronic Atrial Fibrillation- in and out  . ED (erectile dysfunction)    does not desire treatment  . Hyperlipidemia   . Hypertension   . Obesity, unspecified   . Unspecified sleep apnea   . Venous insufficiency    with lower extremity venous varicosities    ROS:   All systems reviewed and negative except as  noted in the HPI.   Past Surgical History:  Procedure Laterality Date  . COLONOSCOPY WITH PROPOFOL N/A 07/03/2015   Procedure: COLONOSCOPY WITH PROPOFOL;  Surgeon: Garlan Fair, MD;  Location: [facility];  Service: Endoscopy;  Laterality: N/A;  . HERNIA REPAIR     inguinal  . PACEMAKER INSERTION  09-22-2013   BSX single chamber pacemaker implanted by Dr Lovena Le for symptomatic bradycardia  . PERMANENT PACEMAKER INSERTION N/A 09/22/2013   Procedure: PERMANENT PACEMAKER INSERTION;  Surgeon: Evans Lance, MD;  Location: [facility] CATH LAB;  Service: Cardiovascular;  Laterality: N/A;  . VARICOSE VEIN SURGERY Bilateral      Family History  Problem Relation Age of Onset  . CAD Mother   . Breast cancer Mother   . Diabetes Mother   . CAD Father   . Prostate cancer Father   . Heart failure Brother   . CAD Brother      Social History   Socioeconomic History  . Marital status: Married    Spouse name: Not on file  . Number of children: Not on file  . Years of education: Not on file  . Highest education level: Not on file  Occupational History  . Not on file  Social Needs  . Financial resource strain: Not on file  .  Food insecurity:    Worry: Not on file    Inability: Not on file  . Transportation needs:    Medical: Not on file    Non-medical: Not on file  Tobacco Use  . Smoking status: Former Smoker    Last attempt to quit: 06/25/1985    Years since quitting: 32.3  . Smokeless tobacco: Never Used  Substance and Sexual Activity  . Alcohol use: No  . Drug use: No  . Sexual activity: Not on file  Lifestyle  . Physical activity:    Days per week: Not on file    Minutes per session: Not on file  . Stress: Not on file  Relationships  . Social connections:    Talks on phone: Not on file    Gets together: Not on file    Attends religious service: Not on file    Active member of club or organization: Not on file    Attends meetings of clubs or organizations: Not on file     Relationship status: Not on file  . Intimate partner violence:    Fear of current or ex partner: Not on file    Emotionally abused: Not on file    Physically abused: Not on file    Forced sexual activity: Not on file  Other Topics Concern  . Not on file  Social History Narrative  . Not on file     BP 134/64   Pulse (!) 58   Ht 6\' 1"  (1.854 m)   Wt 260 lb 12.8 oz (118.3 kg)   BMI 34.41 kg/m   Physical Exam:  Well appearing 77 yo man, NAD HEENT: Unremarkable Neck:  6 cm JVD, no thyromegally Lymphatics:  No adenopathy Back:  No CVA tenderness Lungs:  Clear with no wheezes HEART:  Regular rate rhythm, no murmurs, no rubs, no clicks Abd:  soft, positive bowel sounds, no organomegally, no rebound, no guarding Ext:  2 plus pulses, no edema, no cyanosis, no clubbing Skin:  No rashes no nodules Neuro:  CN II through XII intact, motor grossly intact  DEVICE  Normal device function.  See PaceArt for details.   Assess/Plan: 1. Atrial fib - his ventricular rate is well controlled. No change in his meds. 2. PPM - his [employer] sci single chamber pPM is working normally. We will recheck in several months. 3. HTN - his blood pressure is well controlled. We will follow. 4. Dyspnea - I am concerned that he may have developed pacing induced LV dysfunction. I discussed proceeding with a 2D echo. If his EF were down we would consider upgrade to a biv PPM as he is pacing almost all of the time.  Mikle Bosworth.D.

## 2017-10-08 NOTE — Patient Instructions (Addendum)
Medication Instructions:  Your physician recommends that you continue on your current medications as directed. Please refer to the Current Medication list given to you today.  Labwork: None ordered.  Testing/Procedures: Your physician has requested that you have an echocardiogram. Echocardiography is a painless test that uses sound waves to create images of your heart. It provides your doctor with information about the size and shape of your heart and how well your heart's chambers and valves are working. This procedure takes approximately one hour. There are no restrictions for this procedure.  Please schedule for an ECHO  Follow-Up: Your physician wants you to follow-up in: one year with Dr. Lovena Le.   You will receive a reminder letter in the mail two months in advance. If you don't receive a letter, please call our office to schedule the follow-up appointment.  Remote monitoring is used to monitor your Pacemaker from home. This monitoring reduces the number of office visits required to check your device to one time per year. It allows [place] to keep an eye on the functioning of your device to ensure it is working properly. You are scheduled for a device check from home on 10/21/2017. You may send your transmission at any time that day. If you have a wireless device, the transmission will be sent automatically. After your physician reviews your transmission, you will receive a postcard with your next transmission date.  Any Other Special Instructions Will Be Listed Below (If Applicable).  If you need a refill on your cardiac medications before your next appointment, please call your pharmacy.

## 2017-10-12 LAB — CUP PACEART INCLINIC DEVICE CHECK
Date Time Interrogation Session: 20190821040000
Implantable Lead Implant Date: 20150805
Implantable Lead Model: 4137
Implantable Pulse Generator Implant Date: 20150805
Lead Channel Impedance Value: 729 Ohm
MDC IDC LEAD LOCATION: 753860
MDC IDC LEAD SERIAL: 29478499
MDC IDC SET LEADCHNL RV PACING AMPLITUDE: 1.5 V
MDC IDC SET LEADCHNL RV PACING PULSEWIDTH: 0.4 ms
MDC IDC SET LEADCHNL RV SENSING SENSITIVITY: 2.5 mV
Pulse Gen Serial Number: 119862

## 2017-10-15 ENCOUNTER — Other Ambulatory Visit (HOSPITAL_COMMUNITY): Payer: Medicare HMO

## 2017-10-21 ENCOUNTER — Telehealth: Payer: Self-pay | Admitting: Cardiology

## 2017-10-21 ENCOUNTER — Ambulatory Visit (INDEPENDENT_AMBULATORY_CARE_PROVIDER_SITE_OTHER): Payer: Medicare HMO | Admitting: *Deleted

## 2017-10-21 DIAGNOSIS — I442 Atrioventricular block, complete: Secondary | ICD-10-CM

## 2017-10-21 NOTE — Telephone Encounter (Signed)
LMOVM reminding pt to send remote transmission.   

## 2017-10-22 DIAGNOSIS — R69 Illness, unspecified: Secondary | ICD-10-CM | POA: Diagnosis not present

## 2017-10-22 NOTE — Progress Notes (Signed)
Remote pacemaker transmission.   

## 2017-10-23 ENCOUNTER — Ambulatory Visit (HOSPITAL_COMMUNITY): Payer: Medicare HMO | Attending: Internal Medicine

## 2017-10-23 ENCOUNTER — Other Ambulatory Visit: Payer: Self-pay

## 2017-10-23 DIAGNOSIS — I340 Nonrheumatic mitral (valve) insufficiency: Secondary | ICD-10-CM | POA: Insufficient documentation

## 2017-10-23 DIAGNOSIS — Z6834 Body mass index (BMI) 34.0-34.9, adult: Secondary | ICD-10-CM | POA: Diagnosis not present

## 2017-10-23 DIAGNOSIS — I10 Essential (primary) hypertension: Secondary | ICD-10-CM | POA: Diagnosis not present

## 2017-10-23 DIAGNOSIS — R0602 Shortness of breath: Secondary | ICD-10-CM | POA: Diagnosis not present

## 2017-10-23 DIAGNOSIS — R5383 Other fatigue: Secondary | ICD-10-CM | POA: Diagnosis not present

## 2017-10-23 DIAGNOSIS — E785 Hyperlipidemia, unspecified: Secondary | ICD-10-CM | POA: Insufficient documentation

## 2017-10-23 DIAGNOSIS — I4891 Unspecified atrial fibrillation: Secondary | ICD-10-CM | POA: Diagnosis not present

## 2017-10-23 DIAGNOSIS — G4730 Sleep apnea, unspecified: Secondary | ICD-10-CM | POA: Diagnosis not present

## 2017-10-23 DIAGNOSIS — E119 Type 2 diabetes mellitus without complications: Secondary | ICD-10-CM | POA: Diagnosis not present

## 2017-10-23 DIAGNOSIS — E669 Obesity, unspecified: Secondary | ICD-10-CM | POA: Insufficient documentation

## 2017-10-24 ENCOUNTER — Telehealth: Payer: Self-pay | Admitting: Internal Medicine

## 2017-10-24 NOTE — Telephone Encounter (Signed)
New Message ° ° °Patient is calling to obtain echocardiogram results. Please call.  °

## 2017-10-27 NOTE — Telephone Encounter (Signed)
New Message   Pt is calling back to check on the results to his echocardiogram. Please call

## 2017-10-28 NOTE — Telephone Encounter (Signed)
New message   Patient is calling for ECHO results. Please return call.

## 2017-10-28 NOTE — Telephone Encounter (Signed)
Pt called again re: his echo results.. I advised him that we are waiting to hear back from Dr. Lovena Le but reassured him that Dr. Lovena Le will let us know what he would like to do going forward based on his results.. Pt verbalized his understanding and is planning on going to the [place] area this weekend. Advised pt that he should keep his plans and will forward message to Dr. Lovena Le.. I also advised him that his heart function was strong but we need to hear form Dr. Lovena Le about his thoughts going forward. Pt verbalized understanding and will wait for a call back.

## 2017-10-28 NOTE — Telephone Encounter (Signed)
Call returned to Pt.  Advised Pt of ECHO results.  No indication for device upgrade at this time.    Pt indicates understanding.

## 2017-11-11 DIAGNOSIS — Z7901 Long term (current) use of anticoagulants: Secondary | ICD-10-CM | POA: Diagnosis not present

## 2017-11-11 LAB — CUP PACEART REMOTE DEVICE CHECK
Implantable Lead Implant Date: 20150805
Implantable Lead Location: 753860
Implantable Lead Model: 4137
Implantable Lead Serial Number: 29478499
MDC IDC PG IMPLANT DT: 20150805
MDC IDC PG SERIAL: 119862
MDC IDC SESS DTM: 20190924101226

## 2017-11-19 DIAGNOSIS — R69 Illness, unspecified: Secondary | ICD-10-CM | POA: Diagnosis not present

## 2017-11-25 ENCOUNTER — Other Ambulatory Visit: Payer: Self-pay | Admitting: Interventional Cardiology

## 2017-12-08 DIAGNOSIS — R972 Elevated prostate specific antigen [PSA]: Secondary | ICD-10-CM | POA: Diagnosis not present

## 2017-12-09 DIAGNOSIS — Z7901 Long term (current) use of anticoagulants: Secondary | ICD-10-CM | POA: Diagnosis not present

## 2017-12-15 DIAGNOSIS — R972 Elevated prostate specific antigen [PSA]: Secondary | ICD-10-CM | POA: Diagnosis not present

## 2017-12-16 DIAGNOSIS — E139 Other specified diabetes mellitus without complications: Secondary | ICD-10-CM | POA: Diagnosis not present

## 2017-12-16 DIAGNOSIS — L84 Corns and callosities: Secondary | ICD-10-CM | POA: Diagnosis not present

## 2017-12-16 DIAGNOSIS — B351 Tinea unguium: Secondary | ICD-10-CM | POA: Diagnosis not present

## 2017-12-17 DIAGNOSIS — L603 Nail dystrophy: Secondary | ICD-10-CM | POA: Diagnosis not present

## 2017-12-24 DIAGNOSIS — L603 Nail dystrophy: Secondary | ICD-10-CM | POA: Diagnosis not present

## 2018-01-01 DIAGNOSIS — E1169 Type 2 diabetes mellitus with other specified complication: Secondary | ICD-10-CM | POA: Diagnosis not present

## 2018-01-01 DIAGNOSIS — Z7901 Long term (current) use of anticoagulants: Secondary | ICD-10-CM | POA: Diagnosis not present

## 2018-01-01 DIAGNOSIS — I482 Chronic atrial fibrillation, unspecified: Secondary | ICD-10-CM | POA: Diagnosis not present

## 2018-01-01 DIAGNOSIS — G4733 Obstructive sleep apnea (adult) (pediatric): Secondary | ICD-10-CM | POA: Diagnosis not present

## 2018-01-01 DIAGNOSIS — E669 Obesity, unspecified: Secondary | ICD-10-CM | POA: Diagnosis not present

## 2018-01-20 ENCOUNTER — Ambulatory Visit (INDEPENDENT_AMBULATORY_CARE_PROVIDER_SITE_OTHER): Payer: Medicare HMO

## 2018-01-20 DIAGNOSIS — I442 Atrioventricular block, complete: Secondary | ICD-10-CM

## 2018-01-20 DIAGNOSIS — D485 Neoplasm of uncertain behavior of skin: Secondary | ICD-10-CM | POA: Diagnosis not present

## 2018-01-20 DIAGNOSIS — B079 Viral wart, unspecified: Secondary | ICD-10-CM | POA: Diagnosis not present

## 2018-01-20 DIAGNOSIS — L858 Other specified epidermal thickening: Secondary | ICD-10-CM | POA: Diagnosis not present

## 2018-01-20 DIAGNOSIS — Z23 Encounter for immunization: Secondary | ICD-10-CM | POA: Diagnosis not present

## 2018-01-20 NOTE — Progress Notes (Signed)
Remote pacemaker transmission.   

## 2018-02-05 DIAGNOSIS — Z7901 Long term (current) use of anticoagulants: Secondary | ICD-10-CM | POA: Diagnosis not present

## 2018-02-24 DIAGNOSIS — L84 Corns and callosities: Secondary | ICD-10-CM | POA: Diagnosis not present

## 2018-02-24 DIAGNOSIS — B351 Tinea unguium: Secondary | ICD-10-CM | POA: Diagnosis not present

## 2018-02-24 DIAGNOSIS — E139 Other specified diabetes mellitus without complications: Secondary | ICD-10-CM | POA: Diagnosis not present

## 2018-03-07 LAB — CUP PACEART REMOTE DEVICE CHECK
Battery Remaining Longevity: 66 mo
Battery Remaining Percentage: 76 %
Implantable Lead Implant Date: 20150805
Implantable Lead Location: 753860
Implantable Lead Model: 4137
Implantable Lead Serial Number: 29478499
Implantable Pulse Generator Implant Date: 20150805
Lead Channel Impedance Value: 790 Ohm
Lead Channel Pacing Threshold Amplitude: 0.8 V
Lead Channel Pacing Threshold Pulse Width: 0.4 ms
Lead Channel Setting Pacing Amplitude: 1.3 V
MDC IDC PG SERIAL: 119862
MDC IDC SESS DTM: 20191203080100
MDC IDC SET LEADCHNL RV PACING PULSEWIDTH: 0.4 ms
MDC IDC SET LEADCHNL RV SENSING SENSITIVITY: 2.5 mV
MDC IDC STAT BRADY RV PERCENT PACED: 100 %

## 2018-03-10 DIAGNOSIS — Z7901 Long term (current) use of anticoagulants: Secondary | ICD-10-CM | POA: Diagnosis not present

## 2018-04-07 DIAGNOSIS — Z7901 Long term (current) use of anticoagulants: Secondary | ICD-10-CM | POA: Diagnosis not present

## 2018-04-21 ENCOUNTER — Ambulatory Visit (INDEPENDENT_AMBULATORY_CARE_PROVIDER_SITE_OTHER): Payer: Medicare HMO | Admitting: *Deleted

## 2018-04-21 DIAGNOSIS — I442 Atrioventricular block, complete: Secondary | ICD-10-CM

## 2018-04-21 DIAGNOSIS — I4891 Unspecified atrial fibrillation: Secondary | ICD-10-CM

## 2018-04-21 DIAGNOSIS — R69 Illness, unspecified: Secondary | ICD-10-CM | POA: Diagnosis not present

## 2018-04-22 DIAGNOSIS — R69 Illness, unspecified: Secondary | ICD-10-CM | POA: Diagnosis not present

## 2018-04-22 LAB — CUP PACEART REMOTE DEVICE CHECK
Battery Remaining Longevity: 60 mo
Date Time Interrogation Session: 20200303080100
Implantable Pulse Generator Implant Date: 20150805
Lead Channel Impedance Value: 729 Ohm
Lead Channel Pacing Threshold Amplitude: 0.8 V
Lead Channel Pacing Threshold Pulse Width: 0.4 ms
Lead Channel Setting Pacing Amplitude: 1.4 V
Lead Channel Setting Pacing Pulse Width: 0.4 ms
MDC IDC LEAD IMPLANT DT: 20150805
MDC IDC LEAD LOCATION: 753860
MDC IDC LEAD SERIAL: 29478499
MDC IDC MSMT BATTERY REMAINING PERCENTAGE: 73 %
MDC IDC SET LEADCHNL RV SENSING SENSITIVITY: 2.5 mV
MDC IDC STAT BRADY RV PERCENT PACED: 100 %
Pulse Gen Serial Number: 119862

## 2018-04-23 DIAGNOSIS — G4733 Obstructive sleep apnea (adult) (pediatric): Secondary | ICD-10-CM | POA: Diagnosis not present

## 2018-04-28 NOTE — Progress Notes (Signed)
Remote pacemaker transmission.   

## 2018-05-05 DIAGNOSIS — B351 Tinea unguium: Secondary | ICD-10-CM | POA: Diagnosis not present

## 2018-05-05 DIAGNOSIS — Z7901 Long term (current) use of anticoagulants: Secondary | ICD-10-CM | POA: Diagnosis not present

## 2018-05-05 DIAGNOSIS — D2372 Other benign neoplasm of skin of left lower limb, including hip: Secondary | ICD-10-CM | POA: Diagnosis not present

## 2018-05-05 DIAGNOSIS — E139 Other specified diabetes mellitus without complications: Secondary | ICD-10-CM | POA: Diagnosis not present

## 2018-05-12 ENCOUNTER — Encounter: Payer: Self-pay | Admitting: Interventional Cardiology

## 2018-05-18 ENCOUNTER — Telehealth: Payer: Self-pay

## 2018-05-18 NOTE — Telephone Encounter (Signed)
Called pt to see if he wanted to do an evisit, left voicemail asking pt to call the office.

## 2018-05-18 NOTE — Telephone Encounter (Signed)
Pt agreed to phone visit and gave verbal consent. Pt is aware nurse will call to set up appt.      Virtual Visit Pre-Appointment Phone Call  Steps For Call:  1. Confirm consent - "In the setting of the current Covid19 crisis, you are scheduled for a (phone or video) visit with your provider on (date) at (time).  Just as we do with many in-office visits, in order for you to participate in this visit, we must obtain consent.  If you'd like, I can send this to your mychart (if signed up) or email for you to review.  Otherwise, I can obtain your verbal consent now.  All virtual visits are billed to your insurance company just like a normal visit would be.  By agreeing to a virtual visit, we'd like you to understand that the technology does not allow for your provider to perform an examination, and thus may limit your provider's ability to fully assess your condition.  Finally, though the technology is pretty good, we cannot assure that it will always work on either your or our end, and in the setting of a video visit, we may have to convert it to a phone-only visit.  In either situation, we cannot ensure that we have a secure connection.  Are you willing to proceed?"  2. Give patient instructions for WebEx download to smartphone as below if video visit  3. Advise patient to be prepared with any vital sign or heart rhythm information, their current medicines, and a piece of paper and pen handy for any instructions they may receive the day of their visit  4. Inform patient they will receive a phone call 15 minutes prior to their appointment time (may be from unknown caller ID) so they should be prepared to answer  5. Confirm that appointment type is correct in Epic appointment notes (video vs telephone)    TELEPHONE CALL NOTE  Patrick Lozano has been deemed a candidate for a follow-up tele-health visit to limit community exposure during the Covid-19 pandemic. I spoke with the patient via phone to  ensure availability of phone/video source, confirm preferred email & phone number, and discuss instructions and expectations.  I reminded Patrick Lozano to be prepared with any vital sign and/or heart rhythm information that could potentially be obtained via home monitoring, at the time of his visit. I reminded Patrick Lozano to expect a phone call at the time of his visit if his visit.  Did the patient verbally acknowledge consent to treatment?  Patrick Lozano, CMA 05/18/2018 2:39 PM   DOWNLOADING THE [place], go to [employer] and type in WebEx in the search bar. [employer], the blue/green circle. The app is free but as with any other app downloads, their phone may require them to verify saved payment information or Apple password. The patient does NOT have to create an account.  - If Android, ask patient to go to [employer] and type in WebEx in the search bar. [place], the blue/green circle. The app is free but as with any other app downloads, their phone may require them to verify saved payment information or Android password. The patient does NOT have to create an account.   CONSENT FOR TELE-HEALTH VISIT - PLEASE REVIEW  I hereby voluntarily request, consent and authorize [facility] and its employed or contracted physicians, Engineer, materials, nurse practitioners or other licensed health  care professionals (the Practitioner), to provide me with telemedicine health care services (the "Services") as deemed necessary by the treating Practitioner. I acknowledge and consent to receive the Services by the Practitioner via telemedicine. I understand that the telemedicine visit will involve communicating with the Practitioner through live audiovisual communication technology and the disclosure of certain medical information by electronic transmission. I acknowledge that I have been given the opportunity to  request an in-person assessment or other available alternative prior to the telemedicine visit and am voluntarily participating in the telemedicine visit.  I understand that I have the right to withhold or withdraw my consent to the use of telemedicine in the course of my care at any time, without affecting my right to future care or treatment, and that the Practitioner or I may terminate the telemedicine visit at any time. I understand that I have the right to inspect all information obtained and/or recorded in the course of the telemedicine visit and may receive copies of available information for a reasonable fee.  I understand that some of the potential risks of receiving the Services via telemedicine include:  Marland Kitchen Delay or interruption in medical evaluation due to technological equipment failure or disruption; . Information transmitted may not be sufficient (e.g. poor resolution of images) to allow for appropriate medical decision making by the Practitioner; and/or  . In rare instances, security protocols could fail, causing a breach of personal health information.  Furthermore, I acknowledge that it is my responsibility to provide information about my medical history, conditions and care that is complete and accurate to the best of my ability. I acknowledge that Practitioner's advice, recommendations, and/or decision may be based on factors not within their control, such as incomplete or inaccurate data provided by me or distortions of diagnostic images or specimens that may result from electronic transmissions. I understand that the practice of medicine is not an exact science and that Practitioner makes no warranties or guarantees regarding treatment outcomes. I acknowledge that I will receive a copy of this consent concurrently upon execution via email to the email address I last provided but may also request a printed copy by calling the office of [place].    I understand that my insurance  will be billed for this visit.   I have read or had this consent read to me. . I understand the contents of this consent, which adequately explains the benefits and risks of the Services being provided via telemedicine.  . I have been provided ample opportunity to ask questions regarding this consent and the Services and have had my questions answered to my satisfaction. . I give my informed consent for the services to be provided through the use of telemedicine in my medical care  By participating in this telemedicine visit I agree to the above.

## 2018-05-20 NOTE — Telephone Encounter (Addendum)
Virtual Visit Pre-Appointment Phone Call   TELEPHONE CALL NOTE  Patrick Lozano has been deemed a candidate for a follow-up tele-health visit to limit community exposure during the Covid-19 pandemic. I spoke with the patient via phone to ensure availability of phone/video source, confirm preferred email & phone number, and discuss instructions and expectations.  I reminded [name] to be prepared with any vital sign and/or heart rhythm information that could potentially be obtained via home monitoring, at the time of his visit. I reminded Patrick Lozano to expect a phone call at the time of his visit if his visit.  Did the patient verbally acknowledge consent to treatment? YES  TELEPHONE Visit has been scheduled with Dr. Irish Lack on 4/2.   Cleon Gustin, RN 05/20/2018 9:12 PM    CONSENT FOR TELE-HEALTH VISIT - PLEASE REVIEW  I hereby voluntarily request, consent and authorize [facility] and its employed or contracted physicians, physician assistants, nurse practitioners or other licensed health care professionals (the Practitioner), to provide me with telemedicine health care services (the "Services") as deemed necessary by the treating Practitioner. I acknowledge and consent to receive the Services by the Practitioner via telemedicine. I understand that the telemedicine visit will involve communicating with the Practitioner through live audiovisual communication technology and the disclosure of certain medical information by electronic transmission. I acknowledge that I have been given the opportunity to request an in-person assessment or other available alternative prior to the telemedicine visit and am voluntarily participating in the telemedicine visit.  I understand that I have the right to withhold or withdraw my consent to the use of telemedicine in the course of my care at any time, without affecting my right to future care or treatment, and that the Practitioner or I  may terminate the telemedicine visit at any time. I understand that I have the right to inspect all information obtained and/or recorded in the course of the telemedicine visit and may receive copies of available information for a reasonable fee.  I understand that some of the potential risks of receiving the Services via telemedicine include:  Marland Kitchen Delay or interruption in medical evaluation due to technological equipment failure or disruption; . Information transmitted may not be sufficient (e.g. poor resolution of images) to allow for appropriate medical decision making by the Practitioner; and/or  . In rare instances, security protocols could fail, causing a breach of personal health information.  Furthermore, I acknowledge that it is my responsibility to provide information about my medical history, conditions and care that is complete and accurate to the best of my ability. I acknowledge that Practitioner's advice, recommendations, and/or decision may be based on factors not within their control, such as incomplete or inaccurate data provided by me or distortions of diagnostic images or specimens that may result from electronic transmissions. I understand that the practice of medicine is not an exact science and that Practitioner makes no warranties or guarantees regarding treatment outcomes. I acknowledge that I will receive a copy of this consent concurrently upon execution via email to the email address I last provided but may also request a printed copy by calling the office of [place].    I understand that my insurance will be billed for this visit.   I have read or had this consent read to me. . I understand the contents of this consent, which adequately explains the benefits and risks of the Services being provided via telemedicine.  . I have been  provided ample opportunity to ask questions regarding this consent and the Services and have had my questions answered to my satisfaction. . I  give my informed consent for the services to be provided through the use of telemedicine in my medical care  By participating in this telemedicine visit I agree to the above.

## 2018-05-20 NOTE — Progress Notes (Signed)
Virtual Visit via Telephone Note    Evaluation Performed:  Follow-up visit  This visit type was conducted due to national recommendations for restrictions regarding the COVID-19 Pandemic (e.g. social distancing).  This format is felt to be most appropriate for this patient at this time.  All issues noted in this document were discussed and addressed.  No physical exam was performed (except for noted visual exam findings with Video Visits).  Please refer to the patient's chart (MyChart message for video visits and phone note for telephone visits) for the patient's consent to telehealth for [facility].  Date:  05/21/2018   ID:  Patrick Lozano, DOB 1940-04-22, MRN [id number]  Patient Location:  Home  Provider location:   [place], [place]  PCP:  Lavone Orn, MD  Cardiologist:  No primary care provider on file.  Electrophysiologist:  None   Chief Complaint:  AFib  History of Present Illness:    Patrick Lozano is a 78 y.o. male who presents via audio/video conferencing for a telehealth visit today.    He has atrial fibrillation. He had a procedure for his varicose veins a few years ago. His legs feels somewhat better now. Pacer placed inAugust2061for symptomatic bradycardia. Feltmuch better after this.   In the past, BP readings have been low at home at timesafter mowing the lawn. No syncope or lightheadedness reported, since pacer was placed. BP was low after mowing the lawn so lisinopril was stopped.   [place]  Exercise limited by knee pain.  He saw rheumatologist and has osteoarthritis.  Gets benefit from CBD oil on his knuckles.   Since the last visit, he has felt well.  He has lost a few pounds since last year.  Denies : Chest pain. Leg edema. Nitroglycerin use. Orthopnea. Palpitations. Paroxysmal nocturnal dyspnea. Shortness of breath. Syncope.   Mild dizziness when he stands after bending down.  He does feel that his balance is getting worse.     No bleeding problems.  Mild bruising, stable.    The patient does not have symptoms concerning for COVID-19 infection (fever, chills, cough, or new shortness of breath).    Prior CV studies:   The following studies were reviewed today:  2017 Old ECG showed AFib with wide QRS.   Past Medical History:  Diagnosis Date  . Adenomatous colon polyp   . Arthritis    hands, knees  . Atrial fibrillation ([place])    failed cardioversion and passed, rate control and anticoagulation   . Diabetes mellitus without complication ([place])   . Dysrhythmia    Chronic Atrial Fibrillation- in and out  . ED (erectile dysfunction)    does not desire treatment  . Hyperlipidemia   . Hypertension   . Obesity, unspecified   . Unspecified sleep apnea   . Venous insufficiency    with lower extremity venous varicosities   Past Surgical History:  Procedure Laterality Date  . COLONOSCOPY WITH PROPOFOL N/A 07/03/2015   Procedure: COLONOSCOPY WITH PROPOFOL;  Surgeon: Garlan Fair, MD;  Location: [facility] ENDOSCOPY;  Service: Endoscopy;  Laterality: N/A;  . HERNIA REPAIR     inguinal  . PACEMAKER INSERTION  09-22-2013   BSX single chamber pacemaker implanted by Dr Lovena Le for symptomatic bradycardia  . PERMANENT PACEMAKER INSERTION N/A 09/22/2013   Procedure: PERMANENT PACEMAKER INSERTION;  Surgeon: Evans Lance, MD;  Location: [facility] CATH LAB;  Service: Cardiovascular;  Laterality: N/A;  . VARICOSE VEIN SURGERY Bilateral  Current Meds  Medication Sig  . atorvastatin (LIPITOR) 10 MG tablet Take 10 mg by mouth daily.  . fluticasone (FLONASE) 50 MCG/ACT nasal spray Place 1 spray into both nostrils 2 (two) times daily as needed for allergies.  Marland Kitchen glipiZIDE (GLUCOTROL) 10 MG tablet Take 5 mg by mouth 2 (two) times daily before a meal.   . metFORMIN (GLUCOPHAGE) 500 MG tablet Take 1,000 mg by mouth 2 (two) times daily with a meal.   . Multiple Vitamin (MULTIVITAMIN WITH MINERALS) TABS tablet Take 1 tablet by mouth daily  with supper.   . Omega-3 Fatty Acids (FISH OIL) 1000 MG CAPS Take 2,000 mg by mouth 2 (two) times daily.   . verapamil (CALAN-SR) 240 MG CR tablet TAKE 1 TABLET (240 MG TOTAL) BY MOUTH AT BEDTIME.  Marland Kitchen warfarin (COUMADIN) 10 MG tablet 5-10 mg daily at 6 PM. He takes one tablet everyday except on Wednesdays. He takes half a tablet on that one day.     Allergies:   Lisinopril   Social History   Tobacco Use  . Smoking status: Former Smoker    Last attempt to quit: 06/25/1985    Years since quitting: 32.9  . Smokeless tobacco: Never Used  Substance Use Topics  . Alcohol use: No  . Drug use: No     Family Hx: The patient's family history includes Breast cancer in his mother; CAD in his brother, father, and mother; Diabetes in his mother; Heart failure in his brother; Prostate cancer in his father.  ROS:   Please see the history of present illness.    Decreasing balance; leg swelling stable-follows with podiatrist All other systems reviewed and are negative.   Labs/Other Tests and Data Reviewed:    Recent Labs: No results found for requested labs within last 8760 hours.   Recent Lipid Panel Lab Results  Component Value Date/Time   CHOL 119 11/18/2013 10:22 AM   TRIG 98.0 11/18/2013 10:22 AM   HDL 32.70 (L) 11/18/2013 10:22 AM   CHOLHDL 4 11/18/2013 10:22 AM   LDLCALC 67 11/18/2013 10:22 AM    Wt Readings from Last 3 Encounters:  05/21/18 257 lb (116.6 kg)  10/08/17 260 lb 12.8 oz (118.3 kg)  05/06/17 262 lb (118.8 kg)     Objective:    Vital Signs:  BP 130/72   Pulse 60   Ht 6\' 1"  (1.854 m)   Wt 257 lb (116.6 kg)   BMI 33.91 kg/m    Well nourished, well developed male in no acute distress. No audible wheezing  ASSESSMENT & PLAN:    1.  AFib: Appears to be doing well.  Coumadin for stroke prevention.  No signs of life-threatening bleeding.   Hyperlipidemia: LDL 53 in 5/19. COntinue lipid lowering therapy.   DM: 7.7 in 11/19.  Discussed exercise and healthy  diet.   Hypertensive heart disease: The current medical regimen is effective;  continue present plan and medications.  Pacer: F/u with Dr. Lovena Le.   COVID-19 Education: The signs and symptoms of COVID-19 were discussed with the patient and how to seek care for testing (follow up with PCP or arrange E-visit).  The importance of social distancing was discussed today.  Patient Risk:   After full review of this patient's clinical status, I feel that they are at least moderate risk at this time.  Time:   Today, I have spent 25 minutes with the patient with telehealth technology discussing AFib, COVID precautions.     Medication Adjustments/Labs  and Tests Ordered: Current medicines are reviewed at length with the patient today.  Concerns regarding medicines are outlined above.  Tests Ordered: No orders of the defined types were placed in this encounter.  Medication Changes: No orders of the defined types were placed in this encounter.   Disposition:  Follow up in 1 year(s)  Signed, Larae Grooms, MD  05/21/2018 8:20 AM    [facility]

## 2018-05-20 NOTE — Telephone Encounter (Signed)
Called pt to set up possible evisit in place of OV 05/21/2018, left voicemail instructing pt to call the office back.

## 2018-05-21 ENCOUNTER — Encounter: Payer: Self-pay | Admitting: Interventional Cardiology

## 2018-05-21 ENCOUNTER — Ambulatory Visit (INDEPENDENT_AMBULATORY_CARE_PROVIDER_SITE_OTHER): Payer: Medicare HMO | Admitting: Interventional Cardiology

## 2018-05-21 ENCOUNTER — Other Ambulatory Visit: Payer: Self-pay

## 2018-05-21 VITALS — BP 130/72 | HR 60 | Ht 73.0 in | Wt 257.0 lb

## 2018-05-21 DIAGNOSIS — I10 Essential (primary) hypertension: Secondary | ICD-10-CM

## 2018-05-21 DIAGNOSIS — E119 Type 2 diabetes mellitus without complications: Secondary | ICD-10-CM | POA: Diagnosis not present

## 2018-05-21 DIAGNOSIS — E782 Mixed hyperlipidemia: Secondary | ICD-10-CM

## 2018-05-21 DIAGNOSIS — I482 Chronic atrial fibrillation, unspecified: Secondary | ICD-10-CM

## 2018-05-21 DIAGNOSIS — Z950 Presence of cardiac pacemaker: Secondary | ICD-10-CM | POA: Diagnosis not present

## 2018-05-21 NOTE — Patient Instructions (Addendum)
Medication Instructions:  Your physician recommends that you continue on your current medications as directed. Please refer to the Current Medication list given to you today.  If you need a refill on your cardiac medications before your next appointment, please call your pharmacy.   Lab work: None Ordered  If you have labs (blood work) drawn today and your tests are completely normal, you will receive your results only by: Marland Kitchen MyChart Message (if you have MyChart) OR . A paper copy in the mail If you have any lab test that is abnormal or we need to change your treatment, we will call you to review the results.  Testing/Procedures: None ordered  Follow-Up: At [facility], you and your health needs are our priority.  As part of our continuing mission to provide you with exceptional heart care, we have created designated Provider Care Teams.  These Care Teams include your primary Cardiologist (physician) and Advanced Practice Providers (APPs -  Physician Assistants and Nurse Practitioners) who all work together to provide you with the care you need, when you need it.  . Your physician wants you to follow-up in: 6 months with Dr. Lovena Le. You will receive a reminder letter in the mail two months in advance. If you don't receive a letter, please call our office to schedule the follow-up appointment.  . You will need a follow up appointment in 1 year.  Please call our office 2 months in advance to schedule this appointment.  You may see Casandra Doffing, MD or one of the following Advanced Practice Providers on your designated Care Team:   . Lyda Jester, PA-C . Dayna Dunn, PA-C . Ermalinda Barrios, PA-C  Any Other Special Instructions Will Be Listed Below (If Applicable).

## 2018-05-25 DIAGNOSIS — R69 Illness, unspecified: Secondary | ICD-10-CM | POA: Diagnosis not present

## 2018-06-09 DIAGNOSIS — Z7901 Long term (current) use of anticoagulants: Secondary | ICD-10-CM | POA: Diagnosis not present

## 2018-07-07 DIAGNOSIS — E119 Type 2 diabetes mellitus without complications: Secondary | ICD-10-CM | POA: Diagnosis not present

## 2018-07-07 DIAGNOSIS — Z961 Presence of intraocular lens: Secondary | ICD-10-CM | POA: Diagnosis not present

## 2018-07-07 DIAGNOSIS — H5203 Hypermetropia, bilateral: Secondary | ICD-10-CM | POA: Diagnosis not present

## 2018-07-07 DIAGNOSIS — H52203 Unspecified astigmatism, bilateral: Secondary | ICD-10-CM | POA: Diagnosis not present

## 2018-07-10 DIAGNOSIS — E782 Mixed hyperlipidemia: Secondary | ICD-10-CM | POA: Diagnosis not present

## 2018-07-10 DIAGNOSIS — Z0000 Encounter for general adult medical examination without abnormal findings: Secondary | ICD-10-CM | POA: Diagnosis not present

## 2018-07-10 DIAGNOSIS — Z7984 Long term (current) use of oral hypoglycemic drugs: Secondary | ICD-10-CM | POA: Diagnosis not present

## 2018-07-10 DIAGNOSIS — I872 Venous insufficiency (chronic) (peripheral): Secondary | ICD-10-CM | POA: Diagnosis not present

## 2018-07-10 DIAGNOSIS — G4733 Obstructive sleep apnea (adult) (pediatric): Secondary | ICD-10-CM | POA: Diagnosis not present

## 2018-07-10 DIAGNOSIS — I482 Chronic atrial fibrillation, unspecified: Secondary | ICD-10-CM | POA: Diagnosis not present

## 2018-07-10 DIAGNOSIS — E1169 Type 2 diabetes mellitus with other specified complication: Secondary | ICD-10-CM | POA: Diagnosis not present

## 2018-07-10 DIAGNOSIS — Z7901 Long term (current) use of anticoagulants: Secondary | ICD-10-CM | POA: Diagnosis not present

## 2018-07-10 DIAGNOSIS — Z1389 Encounter for screening for other disorder: Secondary | ICD-10-CM | POA: Diagnosis not present

## 2018-07-14 DIAGNOSIS — E139 Other specified diabetes mellitus without complications: Secondary | ICD-10-CM | POA: Diagnosis not present

## 2018-07-14 DIAGNOSIS — B351 Tinea unguium: Secondary | ICD-10-CM | POA: Diagnosis not present

## 2018-07-17 DIAGNOSIS — Z7984 Long term (current) use of oral hypoglycemic drugs: Secondary | ICD-10-CM | POA: Diagnosis not present

## 2018-07-17 DIAGNOSIS — Z7901 Long term (current) use of anticoagulants: Secondary | ICD-10-CM | POA: Diagnosis not present

## 2018-07-17 DIAGNOSIS — E1169 Type 2 diabetes mellitus with other specified complication: Secondary | ICD-10-CM | POA: Diagnosis not present

## 2018-07-17 DIAGNOSIS — E782 Mixed hyperlipidemia: Secondary | ICD-10-CM | POA: Diagnosis not present

## 2018-07-21 ENCOUNTER — Ambulatory Visit (INDEPENDENT_AMBULATORY_CARE_PROVIDER_SITE_OTHER): Payer: Medicare HMO | Admitting: *Deleted

## 2018-07-21 DIAGNOSIS — I442 Atrioventricular block, complete: Secondary | ICD-10-CM | POA: Diagnosis not present

## 2018-07-22 LAB — CUP PACEART REMOTE DEVICE CHECK
Battery Remaining Longevity: 60 mo
Battery Remaining Percentage: 70 %
Brady Statistic RV Percent Paced: 100 %
Date Time Interrogation Session: 20200602070100
Implantable Lead Implant Date: 20150805
Implantable Lead Location: 753860
Implantable Lead Model: 4137
Implantable Lead Serial Number: 29478499
Implantable Pulse Generator Implant Date: 20150805
Lead Channel Impedance Value: 721 Ohm
Lead Channel Pacing Threshold Amplitude: 1 V
Lead Channel Pacing Threshold Pulse Width: 0.4 ms
Lead Channel Setting Pacing Amplitude: 1.4 V
Lead Channel Setting Pacing Pulse Width: 0.4 ms
Lead Channel Setting Sensing Sensitivity: 2.5 mV
Pulse Gen Serial Number: 119862

## 2018-07-29 ENCOUNTER — Encounter: Payer: Self-pay | Admitting: Cardiology

## 2018-07-29 NOTE — Progress Notes (Signed)
Remote pacemaker transmission.   

## 2018-07-30 DIAGNOSIS — G4733 Obstructive sleep apnea (adult) (pediatric): Secondary | ICD-10-CM | POA: Diagnosis not present

## 2018-08-18 DIAGNOSIS — Z20828 Contact with and (suspected) exposure to other viral communicable diseases: Secondary | ICD-10-CM | POA: Diagnosis not present

## 2018-08-18 DIAGNOSIS — Z7901 Long term (current) use of anticoagulants: Secondary | ICD-10-CM | POA: Diagnosis not present

## 2018-08-20 ENCOUNTER — Telehealth: Payer: Self-pay

## 2018-08-20 DIAGNOSIS — Z20822 Contact with and (suspected) exposure to covid-19: Secondary | ICD-10-CM

## 2018-08-20 DIAGNOSIS — R69 Illness, unspecified: Secondary | ICD-10-CM | POA: Diagnosis not present

## 2018-08-20 NOTE — Addendum Note (Signed)
Addended by: Alan Ripper on: 08/20/2018 05:03 PM   Modules accepted: Orders

## 2018-08-20 NOTE — Telephone Encounter (Signed)
Incoming call from Blanch Media of Dr.  Laurann [place] office.  Requesting that pt.  Be tested for. Covid-19.  Call to Pt.  left message I will return the call.  Pt. Request Monday.at or around 1030am 08/24/18. With his wife.  Return call to Pt again no answer left message tat Patient is scheduled along with his wife on Monday 08/24/18 @10 :30 am.  Left message on answering machine.

## 2018-08-24 ENCOUNTER — Other Ambulatory Visit: Payer: Medicare HMO

## 2018-08-24 DIAGNOSIS — Z20822 Contact with and (suspected) exposure to covid-19: Secondary | ICD-10-CM

## 2018-08-24 DIAGNOSIS — R6889 Other general symptoms and signs: Secondary | ICD-10-CM | POA: Diagnosis not present

## 2018-08-29 LAB — NOVEL CORONAVIRUS, NAA: SARS-CoV-2, NAA: NOT DETECTED

## 2018-09-18 DIAGNOSIS — Z7901 Long term (current) use of anticoagulants: Secondary | ICD-10-CM | POA: Diagnosis not present

## 2018-09-22 DIAGNOSIS — B351 Tinea unguium: Secondary | ICD-10-CM | POA: Diagnosis not present

## 2018-09-22 DIAGNOSIS — E139 Other specified diabetes mellitus without complications: Secondary | ICD-10-CM | POA: Diagnosis not present

## 2018-10-16 DIAGNOSIS — Z7901 Long term (current) use of anticoagulants: Secondary | ICD-10-CM | POA: Diagnosis not present

## 2018-10-29 ENCOUNTER — Ambulatory Visit (INDEPENDENT_AMBULATORY_CARE_PROVIDER_SITE_OTHER): Payer: Medicare HMO | Admitting: *Deleted

## 2018-10-29 DIAGNOSIS — I4891 Unspecified atrial fibrillation: Secondary | ICD-10-CM

## 2018-10-29 LAB — CUP PACEART REMOTE DEVICE CHECK
Battery Remaining Longevity: 54 mo
Battery Remaining Percentage: 66 %
Brady Statistic RV Percent Paced: 100 %
Date Time Interrogation Session: 20200910070100
Implantable Lead Implant Date: 20150805
Implantable Lead Location: 753860
Implantable Lead Model: 4137
Implantable Lead Serial Number: 29478499
Implantable Pulse Generator Implant Date: 20150805
Lead Channel Impedance Value: 751 Ohm
Lead Channel Pacing Threshold Amplitude: 0.9 V
Lead Channel Pacing Threshold Pulse Width: 0.4 ms
Lead Channel Setting Pacing Amplitude: 1.5 V
Lead Channel Setting Pacing Pulse Width: 0.4 ms
Lead Channel Setting Sensing Sensitivity: 2.5 mV
Pulse Gen Serial Number: 119862

## 2018-11-04 DIAGNOSIS — G4733 Obstructive sleep apnea (adult) (pediatric): Secondary | ICD-10-CM | POA: Diagnosis not present

## 2018-11-06 DIAGNOSIS — R69 Illness, unspecified: Secondary | ICD-10-CM | POA: Diagnosis not present

## 2018-11-09 DIAGNOSIS — G4733 Obstructive sleep apnea (adult) (pediatric): Secondary | ICD-10-CM | POA: Diagnosis not present

## 2018-11-09 NOTE — Progress Notes (Signed)
Remote pacemaker transmission.   

## 2018-11-16 DIAGNOSIS — Z7901 Long term (current) use of anticoagulants: Secondary | ICD-10-CM | POA: Diagnosis not present

## 2018-11-17 ENCOUNTER — Other Ambulatory Visit: Payer: Self-pay | Admitting: Interventional Cardiology

## 2018-11-26 ENCOUNTER — Encounter: Payer: Self-pay | Admitting: Internal Medicine

## 2018-11-26 ENCOUNTER — Ambulatory Visit: Payer: Medicare HMO | Admitting: Internal Medicine

## 2018-11-26 ENCOUNTER — Other Ambulatory Visit: Payer: Self-pay

## 2018-11-26 VITALS — BP 132/66 | HR 60 | Ht 73.0 in | Wt 250.0 lb

## 2018-11-26 DIAGNOSIS — I482 Chronic atrial fibrillation, unspecified: Secondary | ICD-10-CM | POA: Diagnosis not present

## 2018-11-26 DIAGNOSIS — I442 Atrioventricular block, complete: Secondary | ICD-10-CM

## 2018-11-26 DIAGNOSIS — I10 Essential (primary) hypertension: Secondary | ICD-10-CM

## 2018-11-26 DIAGNOSIS — Z950 Presence of cardiac pacemaker: Secondary | ICD-10-CM

## 2018-11-26 NOTE — Progress Notes (Signed)
HPI Mr. Deloe returns today for follow-up. He is a very pleasant 78 year old man with a history of chronic atrial fibrillation and complete heart block, status post permanent pacemaker insertion. He has done well in the interim except he feels some increased fatigue and weakness and dsypnea with exertion, especially when walking. His balance is a little bit off as well. Unchanged from last year. Allergies  Allergen Reactions  . Lisinopril     "Very low blood pressure"     Current Outpatient Medications  Medication Sig Dispense Refill  . atorvastatin (LIPITOR) 10 MG tablet Take 10 mg by mouth daily.    . fluticasone (FLONASE) 50 MCG/ACT nasal spray Place 1 spray into both nostrils 2 (two) times daily as needed for allergies.    Marland Kitchen glipiZIDE (GLUCOTROL) 10 MG tablet Take 5 mg by mouth 2 (two) times daily before a meal.     . metFORMIN (GLUCOPHAGE) 500 MG tablet Take 1,000 mg by mouth 2 (two) times daily with a meal.     . Multiple Vitamin (MULTIVITAMIN WITH MINERALS) TABS tablet Take 1 tablet by mouth daily with supper.     . Omega-3 Fatty Acids (FISH OIL) 1000 MG CAPS Take 2,000 mg by mouth 2 (two) times daily.     . verapamil (CALAN-SR) 240 MG CR tablet TAKE 1 TABLET BY MOUTH AT BEDTIME 90 tablet 2  . warfarin (COUMADIN) 10 MG tablet 5-10 mg daily at 6 PM. He takes one tablet everyday except on Wednesdays. He takes half a tablet on that one day.     No current facility-administered medications for this visit.      Past Medical History:  Diagnosis Date  . Adenomatous colon polyp   . Arthritis    hands, knees  . Atrial fibrillation ([place])    failed cardioversion and passed, rate control and anticoagulation   . Diabetes mellitus without complication ([place])   . Dysrhythmia    Chronic Atrial Fibrillation- in and out  . ED (erectile dysfunction)    does not desire treatment  . Hyperlipidemia   . Hypertension   . Obesity, unspecified   . Unspecified sleep apnea   . Venous  insufficiency    with lower extremity venous varicosities    ROS:   All systems reviewed and negative except as noted in the HPI.   Past Surgical History:  Procedure Laterality Date  . COLONOSCOPY WITH PROPOFOL N/A 07/03/2015   Procedure: COLONOSCOPY WITH PROPOFOL;  Surgeon: Garlan Fair, MD;  Location: [facility] ENDOSCOPY;  Service: Endoscopy;  Laterality: N/A;  . HERNIA REPAIR     inguinal  . PACEMAKER INSERTION  09-22-2013   BSX single chamber pacemaker implanted by Dr Lovena Le for symptomatic bradycardia  . PERMANENT PACEMAKER INSERTION N/A 09/22/2013   Procedure: PERMANENT PACEMAKER INSERTION;  Surgeon: Evans Lance, MD;  [facility] LAB;  Service: Cardiovascular;  Laterality: N/A;  . VARICOSE VEIN SURGERY Bilateral      Family History  Problem Relation Age of Onset  . CAD Mother   . Breast cancer Mother   . Diabetes Mother   . CAD Father   . Prostate cancer Father   . Heart failure Brother   . CAD Brother      Social History   Socioeconomic History  . Marital status: Married    Spouse name: Not on file  . Number of children: Not on file  . Years of education: Not on file  . Highest education level:  Not on file  Occupational History  . Not on file  Social Needs  . Financial resource strain: Not on file  . Food insecurity    Worry: Not on file    Inability: Not on file  . Transportation needs    Medical: Not on file    Non-medical: Not on file  Tobacco Use  . Smoking status: Former Smoker    Quit date: 06/25/1985    Years since quitting: 33.4  . Smokeless tobacco: Never Used  Substance and Sexual Activity  . Alcohol use: No  . Drug use: No  . Sexual activity: Not on file  Lifestyle  . Physical activity    Days per week: Not on file    Minutes per session: Not on file  . Stress: Not on file  Relationships  . Social Herbalist on phone: Not on file    Gets together: Not on file    Attends religious service: Not on file    Active member  of club or organization: Not on file    Attends meetings of clubs or organizations: Not on file    Relationship status: Not on file  . Intimate partner violence    Fear of current or ex partner: Not on file    Emotionally abused: Not on file    Physically abused: Not on file    Forced sexual activity: Not on file  Other Topics Concern  . Not on file  Social History Narrative  . Not on file     BP 132/66   Pulse 60   Ht 6\' 1"  (1.854 m)   Wt 250 lb (113.4 kg)   SpO2 95%   BMI 32.98 kg/m   Physical Exam:  Well appearing NAD HEENT: Unremarkable Neck:  No JVD, no thyromegally Lymphatics:  No adenopathy Back:  No CVA tenderness Lungs:  Clear with no wheezes HEART:  Regular rate rhythm, no murmurs, no rubs, no clicks Abd:  soft, positive bowel sounds, no organomegally, no rebound, no guarding Ext:  2 plus pulses, no edema, no cyanosis, no clubbing Skin:  No rashes no nodules Neuro:  CN II through XII intact, motor grossly intact  EKG - atrial fib with ventricular pacing  DEVICE  Normal device function.  See PaceArt for details.   Assess/Plan: 1. Atrial fib - his VR is well controlled. No change in meds. 2. CHB - he is asymptomatic, s/p PPM insertion. 3. PPM - his [place] [employer] single chamber PPM is working normally.   Mikle Bosworth.D.

## 2018-11-26 NOTE — Patient Instructions (Signed)
Medication Instructions:  Your physician recommends that you continue on your current medications as directed. Please refer to the Current Medication list given to you today.  Labwork: None ordered.  Testing/Procedures: None ordered.  Follow-Up: Your physician wants you to follow-up in: one year with Dr. Lovena Le.  You will receive a reminder letter in the mail two months in advance. If you don't receive a letter, please call our office to schedule the follow-up appointment.  Remote monitoring is used to monitor your Pacemaker from home. This monitoring reduces the number of office visits required to check your device to one time per year. It allows [place] to keep an eye on the functioning of your device to ensure it is working properly. You are scheduled for a device check from home on 01/28/2019. You may send your transmission at any time that day. If you have a wireless device, the transmission will be sent automatically. After your physician reviews your transmission, you will receive a postcard with your next transmission date.  Any Other Special Instructions Will Be Listed Below (If Applicable).  If you need a refill on your cardiac medications before your next appointment, please call your pharmacy.

## 2018-11-30 DIAGNOSIS — R972 Elevated prostate specific antigen [PSA]: Secondary | ICD-10-CM | POA: Diagnosis not present

## 2018-12-01 DIAGNOSIS — E139 Other specified diabetes mellitus without complications: Secondary | ICD-10-CM | POA: Diagnosis not present

## 2018-12-01 DIAGNOSIS — B351 Tinea unguium: Secondary | ICD-10-CM | POA: Diagnosis not present

## 2018-12-03 DIAGNOSIS — N401 Enlarged prostate with lower urinary tract symptoms: Secondary | ICD-10-CM | POA: Diagnosis not present

## 2018-12-03 DIAGNOSIS — R351 Nocturia: Secondary | ICD-10-CM | POA: Diagnosis not present

## 2018-12-03 DIAGNOSIS — R972 Elevated prostate specific antigen [PSA]: Secondary | ICD-10-CM | POA: Diagnosis not present

## 2018-12-04 DIAGNOSIS — G4733 Obstructive sleep apnea (adult) (pediatric): Secondary | ICD-10-CM | POA: Diagnosis not present

## 2018-12-31 DIAGNOSIS — R21 Rash and other nonspecific skin eruption: Secondary | ICD-10-CM | POA: Diagnosis not present

## 2018-12-31 DIAGNOSIS — D6869 Other thrombophilia: Secondary | ICD-10-CM | POA: Diagnosis not present

## 2018-12-31 DIAGNOSIS — E1169 Type 2 diabetes mellitus with other specified complication: Secondary | ICD-10-CM | POA: Diagnosis not present

## 2019-01-04 DIAGNOSIS — G4733 Obstructive sleep apnea (adult) (pediatric): Secondary | ICD-10-CM | POA: Diagnosis not present

## 2019-02-02 ENCOUNTER — Ambulatory Visit (INDEPENDENT_AMBULATORY_CARE_PROVIDER_SITE_OTHER): Payer: Medicare HMO | Admitting: *Deleted

## 2019-02-02 DIAGNOSIS — I442 Atrioventricular block, complete: Secondary | ICD-10-CM

## 2019-02-02 LAB — CUP PACEART REMOTE DEVICE CHECK
Battery Remaining Longevity: 54 mo
Battery Remaining Percentage: 63 %
Brady Statistic RV Percent Paced: 100 %
Date Time Interrogation Session: 20201215030100
Implantable Lead Implant Date: 20150805
Implantable Lead Location: 753860
Implantable Lead Model: 4137
Implantable Lead Serial Number: 29478499
Implantable Pulse Generator Implant Date: 20150805
Lead Channel Impedance Value: 774 Ohm
Lead Channel Pacing Threshold Amplitude: 1 V
Lead Channel Pacing Threshold Pulse Width: 0.4 ms
Lead Channel Setting Pacing Amplitude: 1.5 V
Lead Channel Setting Pacing Pulse Width: 0.4 ms
Lead Channel Setting Sensing Sensitivity: 2.5 mV
Pulse Gen Serial Number: 119862

## 2019-02-09 DIAGNOSIS — B351 Tinea unguium: Secondary | ICD-10-CM | POA: Diagnosis not present

## 2019-02-09 DIAGNOSIS — E1351 Other specified diabetes mellitus with diabetic peripheral angiopathy without gangrene: Secondary | ICD-10-CM | POA: Diagnosis not present

## 2019-03-02 DIAGNOSIS — R69 Illness, unspecified: Secondary | ICD-10-CM | POA: Diagnosis not present

## 2019-03-06 NOTE — Progress Notes (Signed)
PPM remote 

## 2019-03-08 DIAGNOSIS — G4733 Obstructive sleep apnea (adult) (pediatric): Secondary | ICD-10-CM | POA: Diagnosis not present

## 2019-04-13 DIAGNOSIS — Z7901 Long term (current) use of anticoagulants: Secondary | ICD-10-CM | POA: Diagnosis not present

## 2019-04-15 DIAGNOSIS — R2689 Other abnormalities of gait and mobility: Secondary | ICD-10-CM | POA: Diagnosis not present

## 2019-04-15 DIAGNOSIS — M2041 Other hammer toe(s) (acquired), right foot: Secondary | ICD-10-CM | POA: Diagnosis not present

## 2019-04-15 DIAGNOSIS — B351 Tinea unguium: Secondary | ICD-10-CM | POA: Diagnosis not present

## 2019-04-15 DIAGNOSIS — M6281 Muscle weakness (generalized): Secondary | ICD-10-CM | POA: Diagnosis not present

## 2019-04-15 DIAGNOSIS — M2042 Other hammer toe(s) (acquired), left foot: Secondary | ICD-10-CM | POA: Diagnosis not present

## 2019-04-15 DIAGNOSIS — E139 Other specified diabetes mellitus without complications: Secondary | ICD-10-CM | POA: Diagnosis not present

## 2019-05-05 ENCOUNTER — Ambulatory Visit (INDEPENDENT_AMBULATORY_CARE_PROVIDER_SITE_OTHER): Payer: Medicare HMO | Admitting: *Deleted

## 2019-05-05 DIAGNOSIS — I442 Atrioventricular block, complete: Secondary | ICD-10-CM | POA: Diagnosis not present

## 2019-05-05 LAB — CUP PACEART REMOTE DEVICE CHECK
Battery Remaining Longevity: 54 mo
Battery Remaining Percentage: 60 %
Brady Statistic RV Percent Paced: 100 %
Date Time Interrogation Session: 20210316173200
Implantable Lead Implant Date: 20150805
Implantable Lead Location: 753860
Implantable Lead Model: 4137
Implantable Lead Serial Number: 29478499
Implantable Pulse Generator Implant Date: 20150805
Lead Channel Impedance Value: 792 Ohm
Lead Channel Pacing Threshold Amplitude: 1 V
Lead Channel Pacing Threshold Pulse Width: 0.4 ms
Lead Channel Setting Pacing Amplitude: 1.4 V
Lead Channel Setting Pacing Pulse Width: 0.4 ms
Lead Channel Setting Sensing Sensitivity: 2.5 mV
Pulse Gen Serial Number: 119862

## 2019-05-05 NOTE — Progress Notes (Signed)
PPM Remote  

## 2019-05-06 ENCOUNTER — Ambulatory Visit: Payer: Medicare HMO | Attending: Internal Medicine

## 2019-05-06 DIAGNOSIS — Z23 Encounter for immunization: Secondary | ICD-10-CM

## 2019-05-06 NOTE — Progress Notes (Signed)
   Covid-19 Vaccination Clinic  Name:  Patrick Lozano    MRN: [id number] DOB: 1940-10-11  05/06/2019  Patrick Lozano was observed post Covid-19 immunization for 15 minutes without incident. He was provided with Vaccine Information Sheet and instruction to access the V-Safe system.   Patrick Lozano was instructed to call 911 with any severe reactions post vaccine: Marland Kitchen Difficulty breathing  . Swelling of face and throat  . A fast heartbeat  . A bad rash all over body  . Dizziness and weakness   Immunizations Administered    Name Date Dose VIS Date Route   Pfizer COVID-19 Vaccine 05/06/2019 11:17 AM 0.3 mL 01/29/2019 Intramuscular   Manufacturer: [place]   Lot: [id number]   [place]: [id number]

## 2019-05-11 DIAGNOSIS — Z7901 Long term (current) use of anticoagulants: Secondary | ICD-10-CM | POA: Diagnosis not present

## 2019-05-18 DIAGNOSIS — R69 Illness, unspecified: Secondary | ICD-10-CM | POA: Diagnosis not present

## 2019-05-31 ENCOUNTER — Ambulatory Visit: Payer: Medicare HMO | Attending: Internal Medicine

## 2019-05-31 DIAGNOSIS — Z23 Encounter for immunization: Secondary | ICD-10-CM

## 2019-05-31 NOTE — Progress Notes (Signed)
   Covid-19 Vaccination Clinic  Name:  Patrick Lozano    MRN: [id number] DOB: 05-04-1940  05/31/2019  [name] was observed post Covid-19 immunization for 15 minutes without incident. He was provided with Vaccine Information Sheet and instruction to access the V-Safe system.   [name] was instructed to call 911 with any severe reactions post vaccine: Marland Kitchen Difficulty breathing  . Swelling of face and throat  . A fast heartbeat  . A bad rash all over body  . Dizziness and weakness   Immunizations Administered    Name Date Dose VIS Date Route   Pfizer COVID-19 Vaccine 05/31/2019 11:02 AM 0.3 mL 01/29/2019 Intramuscular   Manufacturer: [place]   Lot: [id number]   [place]: [id number]

## 2019-06-07 DIAGNOSIS — Z7901 Long term (current) use of anticoagulants: Secondary | ICD-10-CM | POA: Diagnosis not present

## 2019-06-24 DIAGNOSIS — B351 Tinea unguium: Secondary | ICD-10-CM | POA: Diagnosis not present

## 2019-06-24 DIAGNOSIS — G4733 Obstructive sleep apnea (adult) (pediatric): Secondary | ICD-10-CM | POA: Diagnosis not present

## 2019-06-24 DIAGNOSIS — E1351 Other specified diabetes mellitus with diabetic peripheral angiopathy without gangrene: Secondary | ICD-10-CM | POA: Diagnosis not present

## 2019-06-28 DIAGNOSIS — R69 Illness, unspecified: Secondary | ICD-10-CM | POA: Diagnosis not present

## 2019-07-04 NOTE — Progress Notes (Signed)
Cardiology Office Note   Date:  07/05/2019   ID:  GAMBIT GOLIA, DOB 1940/09/17, MRN [id number]  PCP:  Lavone Orn, MD    No chief complaint on file.  AFib  Wt Readings from Last 3 Encounters:  07/05/19 252 lb (114.3 kg)  11/26/18 250 lb (113.4 kg)  05/21/18 257 lb (116.6 kg)       History of Present Illness: Patrick Lozano is a 79 y.o. male  who has atrial fibrillation. He had a procedure for his varicose veins a many years ago. His legs feels somewhat better now. Pacer placed [id number] symptomatic bradycardia. Feltmuch better after this.   In the past,BP readings have been low at home at timesafter mowing the lawn. No syncope or lightheadednessreported, since pacer was placed. BP was low after mowing the lawn so lisinopril was stopped.   In the past, it was noted: "[place].Exercise limited by knee pain. He saw rheumatologist and has osteoarthritis.  Gets benefit from CBD oil on his knuckles."  Denies : Chest pain. Dizziness.  Nitroglycerin use. Orthopnea. Palpitations. Paroxysmal nocturnal dyspnea. Shortness of breath. Syncope.   Not exercising as much.  Rare walking.  Occasional mowing.     He got his COVID shots.   No feet problems.     Past Medical History:  Diagnosis Date  . Adenomatous colon polyp   . Arthritis    hands, knees  . Atrial fibrillation ([place])    failed cardioversion and passed, rate control and anticoagulation   . Diabetes mellitus without complication ([place])   . Dysrhythmia    Chronic Atrial Fibrillation- in and out  . ED (erectile dysfunction)    does not desire treatment  . Hyperlipidemia   . Hypertension   . Obesity, unspecified   . Unspecified sleep apnea   . Venous insufficiency    with lower extremity venous varicosities    Past Surgical History:  Procedure Laterality Date  . COLONOSCOPY WITH PROPOFOL N/A 07/03/2015   Procedure: COLONOSCOPY WITH PROPOFOL;  Surgeon: Garlan Fair, MD;   Location: [facility];  Service: Endoscopy;  Laterality: N/A;  . HERNIA REPAIR     inguinal  . PACEMAKER INSERTION  09-22-2013   BSX single chamber pacemaker implanted by Dr Lovena Le for symptomatic bradycardia  . PERMANENT PACEMAKER INSERTION N/A 09/22/2013   Procedure: PERMANENT PACEMAKER INSERTION;  Surgeon: Evans Lance, MD;  Location: [facility] CATH LAB;  Service: Cardiovascular;  Laterality: N/A;  . VARICOSE VEIN SURGERY Bilateral      Current Outpatient Medications  Medication Sig Dispense Refill  . atorvastatin (LIPITOR) 10 MG tablet Take 10 mg by mouth daily.    . fluticasone (FLONASE) 50 MCG/ACT nasal spray Place 1 spray into both nostrils 2 (two) times daily as needed for allergies.    Marland Kitchen glipiZIDE (GLUCOTROL) 10 MG tablet Take 5 mg by mouth 2 (two) times daily before a meal.     . metFORMIN (GLUCOPHAGE) 500 MG tablet Take 1,000 mg by mouth 2 (two) times daily with a meal.     . Multiple Vitamin (MULTIVITAMIN WITH MINERALS) TABS tablet Take 1 tablet by mouth daily with supper.     . Omega-3 Fatty Acids (FISH OIL) 1000 MG CAPS Take 2,000 mg by mouth 2 (two) times daily.     . verapamil (CALAN-SR) 240 MG CR tablet TAKE 1 TABLET BY MOUTH AT BEDTIME 90 tablet 2  . warfarin (COUMADIN) 10 MG tablet 5-10 mg daily at 6 PM. He  takes one tablet everyday except on Wednesdays. He takes half a tablet on that one day.     No current facility-administered medications for this visit.    Allergies:   Lisinopril    Social History:  The patient  reports that he quit smoking about 34 years ago. He has never used smokeless tobacco. He reports that he does not drink alcohol or use drugs.   Family History:  The patient's family history includes Breast cancer in his mother; CAD in his brother, father, and mother; Diabetes in his mother; Heart failure in his brother; Prostate cancer in his father.    ROS:  Please see the history of present illness.   Otherwise, review of systems are positive for less  exercise.   All other systems are reviewed and negative.    PHYSICAL EXAM: VS:  BP (!) 112/56   Pulse 60   Ht 6\' 1"  (1.854 m)   Wt 252 lb (114.3 kg)   SpO2 97%   BMI 33.25 kg/m  , BMI Body mass index is 33.25 kg/m. GEN: Well nourished, well developed, in no acute distress  HEENT: normal  Neck: no JVD, carotid bruits, or masses Cardiac: ; RRR, no murmurs, rubs, or gallops,no edema  Respiratory:  clear to auscultation bilaterally, normal work of breathing GI: soft, nontender, nondistended, + BS MS: no deformity or atrophy  Skin: warm and dry, no rash Neuro:  Strength and sensation are intact Psych: euthymic mood, full affect   EKG:   The ekg in 10/20 ordered today demonstrates V-paced, HR 60.   Recent Labs: No results found for requested labs within last 8760 hours.   Lipid Panel    Component Value Date/Time   CHOL 119 11/18/2013 1022   TRIG 98.0 11/18/2013 1022   HDL 32.70 (L) 11/18/2013 1022   CHOLHDL 4 11/18/2013 1022   VLDL 19.6 11/18/2013 1022   LDLCALC 67 11/18/2013 1022     Other studies Reviewed: Additional studies/ records that were reviewed today with results demonstrating: PMD labs reviewed.   ASSESSMENT AND PLAN:  1. AFib: Rate controlled.  No signs of internal bleeding.  Only nuisance bleeding.   2. Hyperlipidemia: TC 98.  3. DM: 7.8 in 12/2018.  Will be rechecked in a week.  Avoid concentrated sweets. 4. Hypertensive heart disease: The current medical regimen is effective;  continue present plan and medications.  At home, 130-140/70s. 5. Pacer: F/u with Dr. Lovena Le. 10/20 ECG reviewed.  6. Anticoagulated: Coumadin for stroke prevention. Dose has ben stable.  Avoiding aspirin and NSAIDs.    Current medicines are reviewed at length with the patient today.  The patient concerns regarding his medicines were addressed.  The following changes have been made:  No change  Labs/ tests ordered today include:  No orders of the defined types were placed in  this encounter.   Recommend 150 minutes/week of aerobic exercise Low fat, low carb, high fiber diet recommended  Disposition:   FU in 1 year   Signed, Larae Grooms, MD  07/05/2019 11:04 AM    [facility] [place], [place], [place]  [address] Phone: [phone]; Fax: [phone]

## 2019-07-05 ENCOUNTER — Other Ambulatory Visit: Payer: Self-pay

## 2019-07-05 ENCOUNTER — Ambulatory Visit: Payer: Medicare HMO | Admitting: Interventional Cardiology

## 2019-07-05 ENCOUNTER — Encounter: Payer: Self-pay | Admitting: Interventional Cardiology

## 2019-07-05 VITALS — BP 112/56 | HR 60 | Ht 73.0 in | Wt 252.0 lb

## 2019-07-05 DIAGNOSIS — I482 Chronic atrial fibrillation, unspecified: Secondary | ICD-10-CM

## 2019-07-05 DIAGNOSIS — E119 Type 2 diabetes mellitus without complications: Secondary | ICD-10-CM

## 2019-07-05 DIAGNOSIS — E782 Mixed hyperlipidemia: Secondary | ICD-10-CM

## 2019-07-05 DIAGNOSIS — Z7901 Long term (current) use of anticoagulants: Secondary | ICD-10-CM

## 2019-07-05 DIAGNOSIS — I10 Essential (primary) hypertension: Secondary | ICD-10-CM | POA: Diagnosis not present

## 2019-07-05 DIAGNOSIS — Z950 Presence of cardiac pacemaker: Secondary | ICD-10-CM

## 2019-07-05 NOTE — Patient Instructions (Signed)
Medication Instructions:  Your physician recommends that you continue on your current medications as directed. Please refer to the Current Medication list given to you today.  *If you need a refill on your cardiac medications before your next appointment, please call your pharmacy*   Lab Work: None ordered  If you have labs (blood work) drawn today and your tests are completely normal, you will receive your results only by: Marland Kitchen MyChart Message (if you have MyChart) OR . A paper copy in the mail If you have any lab test that is abnormal or we need to change your treatment, we will call you to review the results.   Testing/Procedures: None ordered   Follow-Up: At [facility], you and your health needs are our priority.  As part of our continuing mission to provide you with exceptional heart care, we have created designated Provider Care Teams.  These Care Teams include your primary Cardiologist (physician) and Advanced Practice Providers (APPs -  Physician Assistants and Nurse Practitioners) who all work together to provide you with the care you need, when you need it.  We recommend signing up for the patient portal called "MyChart".  Sign up information is provided on this After Visit Summary.  MyChart is used to connect with patients for Virtual Visits (Telemedicine).  Patients are able to view lab/test results, encounter notes, upcoming appointments, etc.  Non-urgent messages can be sent to your provider as well.   To learn more about what you can do with MyChart, go to [email or web address].    Your next appointment:   12 month(s)  The format for your next appointment:   In Person  Provider:   You may see Casandra Doffing, MD or one of the following Advanced Practice Providers on your designated Care Team:    Melina Copa, PA-C  Ermalinda Barrios, PA-C    Other Instructions  High-Fiber Diet Fiber, also called dietary fiber, is a type of carbohydrate that is found in fruits,  vegetables, whole grains, and beans. A high-fiber diet can have many health benefits. Your health care provider may recommend a high-fiber diet to help:  Prevent constipation. Fiber can make your bowel movements more regular.  Lower your cholesterol.  Relieve the following conditions: ? Swelling of veins in the anus (hemorrhoids). ? Swelling and irritation (inflammation) of specific areas of the digestive tract (uncomplicated diverticulosis). ? A problem of the large intestine (colon) that sometimes causes pain and diarrhea (irritable bowel syndrome, IBS).  Prevent overeating as part of a weight-loss plan.  Prevent heart disease, type 2 diabetes, and certain cancers. What is my plan? The recommended daily fiber intake in grams (g) includes:  38 g for men age 57 or younger.  30 g for men over age 72.  43 g for women age 19 or younger.  21 g for women over age 60. You can get the recommended daily intake of dietary fiber by:  Eating a variety of fruits, vegetables, grains, and beans.  Taking a fiber supplement, if it is not possible to get enough fiber through your diet. What do I need to know about a high-fiber diet?  It is better to get fiber through food sources rather than from fiber supplements. There is not a lot of research about how effective supplements are.  Always check the fiber content on the nutrition facts label of any prepackaged food. Look for foods that contain 5 g of fiber or more per serving.  Talk with a diet and nutrition specialist (  dietitian) if you have questions about specific foods that are recommended or not recommended for your medical condition, especially if those foods are not listed below.  Gradually increase how much fiber you consume. If you increase your intake of dietary fiber too quickly, you may have bloating, cramping, or gas.  Drink plenty of water. Water helps you to digest fiber. What are tips for following this plan?  Eat a wide  variety of high-fiber foods.  Make sure that half of the grains that you eat each day are whole grains.  Eat breads and cereals that are made with whole-grain flour instead of refined flour or white flour.  Eat brown rice, bulgur wheat, or millet instead of white rice.  Start the day with a breakfast that is high in fiber, such as a cereal that contains 5 g of fiber or more per serving.  Use beans in place of meat in soups, salads, and pasta dishes.  Eat high-fiber snacks, such as berries, raw vegetables, nuts, and popcorn.  Choose whole fruits and vegetables instead of processed forms like juice or sauce. What foods can I eat?  Fruits Berries. Pears. Apples. Oranges. Avocado. Prunes and raisins. Dried figs. Vegetables Sweet potatoes. Spinach. Kale. Artichokes. Cabbage. Broccoli. Cauliflower. Green peas. Carrots. Squash. Grains Whole-grain breads. Multigrain cereal. Oats and oatmeal. Brown rice. Barley. Bulgur wheat. Millet. Quinoa. Bran muffins. Popcorn. Rye wafer crackers. Meats and other proteins Navy, kidney, and pinto beans. Soybeans. Split peas. Lentils. Nuts and seeds. Dairy Fiber-fortified yogurt. Beverages Fiber-fortified soy milk. Fiber-fortified orange juice. Other foods Fiber bars. The items listed above may not be a complete list of recommended foods and beverages. Contact a dietitian for more options. What foods are not recommended? Fruits Fruit juice. Cooked, strained fruit. Vegetables Fried potatoes. Canned vegetables. Well-cooked vegetables. Grains White bread. Pasta made with refined flour. White rice. Meats and other proteins Fatty cuts of meat. Fried chicken or fried fish. Dairy Milk. Yogurt. Cream cheese. Sour cream. Fats and oils Butters. Beverages Soft drinks. Other foods Cakes and pastries. The items listed above may not be a complete list of foods and beverages to avoid. Contact a dietitian for more information. Summary  Fiber is a type of  carbohydrate. It is found in fruits, vegetables, whole grains, and beans.  There are many health benefits of eating a high-fiber diet, such as preventing constipation, lowering blood cholesterol, helping with weight loss, and reducing your risk of heart disease, diabetes, and certain cancers.  Gradually increase your intake of fiber. Increasing too fast can result in cramping, bloating, and gas. Drink plenty of water while you increase your fiber.  The best sources of fiber include whole fruits and vegetables, whole grains, nuts, seeds, and beans. This information is not intended to replace advice given to you by your health care provider. Make sure you discuss any questions you have with your health care provider. Document Revised: 12/09/2016 Document Reviewed: 12/09/2016 Elsevier Patient Education  2020 Elsevier Inc.   

## 2019-07-13 DIAGNOSIS — Z0000 Encounter for general adult medical examination without abnormal findings: Secondary | ICD-10-CM | POA: Diagnosis not present

## 2019-07-13 DIAGNOSIS — E1169 Type 2 diabetes mellitus with other specified complication: Secondary | ICD-10-CM | POA: Diagnosis not present

## 2019-07-13 DIAGNOSIS — G4733 Obstructive sleep apnea (adult) (pediatric): Secondary | ICD-10-CM | POA: Diagnosis not present

## 2019-07-13 DIAGNOSIS — Z7984 Long term (current) use of oral hypoglycemic drugs: Secondary | ICD-10-CM | POA: Diagnosis not present

## 2019-07-13 DIAGNOSIS — I482 Chronic atrial fibrillation, unspecified: Secondary | ICD-10-CM | POA: Diagnosis not present

## 2019-07-13 DIAGNOSIS — E782 Mixed hyperlipidemia: Secondary | ICD-10-CM | POA: Diagnosis not present

## 2019-07-13 DIAGNOSIS — Z1389 Encounter for screening for other disorder: Secondary | ICD-10-CM | POA: Diagnosis not present

## 2019-07-13 DIAGNOSIS — D6869 Other thrombophilia: Secondary | ICD-10-CM | POA: Diagnosis not present

## 2019-07-29 ENCOUNTER — Telehealth: Payer: Self-pay

## 2019-07-29 NOTE — Telephone Encounter (Signed)
The pt just wanted to reschedule his home remote appointment. I rescheduled it for 08-09-2019.

## 2019-08-04 ENCOUNTER — Ambulatory Visit (INDEPENDENT_AMBULATORY_CARE_PROVIDER_SITE_OTHER): Payer: Medicare HMO | Admitting: *Deleted

## 2019-08-04 DIAGNOSIS — I442 Atrioventricular block, complete: Secondary | ICD-10-CM | POA: Diagnosis not present

## 2019-08-04 LAB — CUP PACEART REMOTE DEVICE CHECK
Battery Remaining Longevity: 48 mo
Battery Remaining Percentage: 57 %
Brady Statistic RV Percent Paced: 100 %
Date Time Interrogation Session: 20210616033800
Implantable Lead Implant Date: 20150805
Implantable Lead Location: 753860
Implantable Lead Model: 4137
Implantable Lead Serial Number: 29478499
Implantable Pulse Generator Implant Date: 20150805
Lead Channel Impedance Value: 729 Ohm
Lead Channel Pacing Threshold Amplitude: 0.9 V
Lead Channel Pacing Threshold Pulse Width: 0.4 ms
Lead Channel Setting Pacing Amplitude: 1.4 V
Lead Channel Setting Pacing Pulse Width: 0.4 ms
Lead Channel Setting Sensing Sensitivity: 2.5 mV
Pulse Gen Serial Number: 119862

## 2019-08-05 NOTE — Progress Notes (Signed)
Remote pacemaker transmission.   

## 2019-08-09 DIAGNOSIS — E119 Type 2 diabetes mellitus without complications: Secondary | ICD-10-CM | POA: Diagnosis not present

## 2019-08-09 DIAGNOSIS — Z961 Presence of intraocular lens: Secondary | ICD-10-CM | POA: Diagnosis not present

## 2019-08-09 DIAGNOSIS — H524 Presbyopia: Secondary | ICD-10-CM | POA: Diagnosis not present

## 2019-08-09 DIAGNOSIS — H26492 Other secondary cataract, left eye: Secondary | ICD-10-CM | POA: Diagnosis not present

## 2019-08-09 DIAGNOSIS — H52203 Unspecified astigmatism, bilateral: Secondary | ICD-10-CM | POA: Diagnosis not present

## 2019-08-09 DIAGNOSIS — Z7984 Long term (current) use of oral hypoglycemic drugs: Secondary | ICD-10-CM | POA: Diagnosis not present

## 2019-08-09 DIAGNOSIS — H5203 Hypermetropia, bilateral: Secondary | ICD-10-CM | POA: Diagnosis not present

## 2019-08-10 DIAGNOSIS — I482 Chronic atrial fibrillation, unspecified: Secondary | ICD-10-CM | POA: Diagnosis not present

## 2019-08-14 ENCOUNTER — Other Ambulatory Visit: Payer: Self-pay | Admitting: Interventional Cardiology

## 2019-08-25 DIAGNOSIS — H26492 Other secondary cataract, left eye: Secondary | ICD-10-CM | POA: Diagnosis not present

## 2019-09-09 DIAGNOSIS — B351 Tinea unguium: Secondary | ICD-10-CM | POA: Diagnosis not present

## 2019-09-10 DIAGNOSIS — Z7901 Long term (current) use of anticoagulants: Secondary | ICD-10-CM | POA: Diagnosis not present

## 2019-09-28 DIAGNOSIS — H401131 Primary open-angle glaucoma, bilateral, mild stage: Secondary | ICD-10-CM | POA: Diagnosis not present

## 2019-10-08 DIAGNOSIS — R42 Dizziness and giddiness: Secondary | ICD-10-CM | POA: Diagnosis not present

## 2019-10-15 DIAGNOSIS — Z7901 Long term (current) use of anticoagulants: Secondary | ICD-10-CM | POA: Diagnosis not present

## 2019-10-15 DIAGNOSIS — R42 Dizziness and giddiness: Secondary | ICD-10-CM | POA: Diagnosis not present

## 2019-10-18 ENCOUNTER — Ambulatory Visit: Payer: Medicare HMO | Admitting: Neurology

## 2019-11-02 DIAGNOSIS — H401131 Primary open-angle glaucoma, bilateral, mild stage: Secondary | ICD-10-CM | POA: Diagnosis not present

## 2019-11-03 ENCOUNTER — Ambulatory Visit (INDEPENDENT_AMBULATORY_CARE_PROVIDER_SITE_OTHER): Payer: Medicare HMO | Admitting: *Deleted

## 2019-11-03 DIAGNOSIS — I442 Atrioventricular block, complete: Secondary | ICD-10-CM

## 2019-11-03 LAB — CUP PACEART REMOTE DEVICE CHECK
Battery Remaining Longevity: 48 mo
Battery Remaining Percentage: 55 %
Brady Statistic RV Percent Paced: 100 %
Date Time Interrogation Session: 20210915030100
Implantable Lead Implant Date: 20150805
Implantable Lead Location: 753860
Implantable Lead Model: 4137
Implantable Lead Serial Number: 29478499
Implantable Pulse Generator Implant Date: 20150805
Lead Channel Impedance Value: 789 Ohm
Lead Channel Pacing Threshold Amplitude: 1 V
Lead Channel Pacing Threshold Pulse Width: 0.4 ms
Lead Channel Setting Pacing Amplitude: 1.5 V
Lead Channel Setting Pacing Pulse Width: 0.4 ms
Lead Channel Setting Sensing Sensitivity: 2.5 mV
Pulse Gen Serial Number: 119862

## 2019-11-04 NOTE — Progress Notes (Signed)
Remote pacemaker transmission.   

## 2019-11-12 DIAGNOSIS — Z7901 Long term (current) use of anticoagulants: Secondary | ICD-10-CM | POA: Diagnosis not present

## 2019-11-12 DIAGNOSIS — G4733 Obstructive sleep apnea (adult) (pediatric): Secondary | ICD-10-CM | POA: Diagnosis not present

## 2019-11-12 DIAGNOSIS — Z23 Encounter for immunization: Secondary | ICD-10-CM | POA: Diagnosis not present

## 2019-11-12 DIAGNOSIS — I482 Chronic atrial fibrillation, unspecified: Secondary | ICD-10-CM | POA: Diagnosis not present

## 2019-11-12 DIAGNOSIS — E1169 Type 2 diabetes mellitus with other specified complication: Secondary | ICD-10-CM | POA: Diagnosis not present

## 2019-11-18 DIAGNOSIS — B351 Tinea unguium: Secondary | ICD-10-CM | POA: Diagnosis not present

## 2019-11-18 DIAGNOSIS — E139 Other specified diabetes mellitus without complications: Secondary | ICD-10-CM | POA: Diagnosis not present

## 2019-11-23 DIAGNOSIS — R69 Illness, unspecified: Secondary | ICD-10-CM | POA: Diagnosis not present

## 2019-12-10 ENCOUNTER — Encounter: Payer: Self-pay | Admitting: Internal Medicine

## 2019-12-10 ENCOUNTER — Other Ambulatory Visit: Payer: Self-pay

## 2019-12-10 ENCOUNTER — Ambulatory Visit: Payer: Medicare HMO | Admitting: Internal Medicine

## 2019-12-10 VITALS — BP 128/68 | HR 60 | Ht 73.0 in | Wt 253.0 lb

## 2019-12-10 DIAGNOSIS — I10 Essential (primary) hypertension: Secondary | ICD-10-CM | POA: Diagnosis not present

## 2019-12-10 DIAGNOSIS — I482 Chronic atrial fibrillation, unspecified: Secondary | ICD-10-CM | POA: Diagnosis not present

## 2019-12-10 DIAGNOSIS — Z950 Presence of cardiac pacemaker: Secondary | ICD-10-CM

## 2019-12-10 DIAGNOSIS — I442 Atrioventricular block, complete: Secondary | ICD-10-CM | POA: Diagnosis not present

## 2019-12-10 NOTE — Patient Instructions (Signed)
Medication Instructions:  Your physician recommends that you continue on your current medications as directed. Please refer to the Current Medication list given to you today.  Labwork: None ordered.  Testing/Procedures: None ordered.  Follow-Up: Your physician wants you to follow-up in: one year with Dr. Lovena Le.   You will receive a reminder letter in the mail two months in advance. If you don't receive a letter, please call our office to schedule the follow-up appointment.  Remote monitoring is used to monitor your Pacemaker from home. This monitoring reduces the number of office visits required to check your device to one time per year. It allows [place] to keep an eye on the functioning of your device to ensure it is working properly. You are scheduled for a device check from home on 02/02/2020. You may send your transmission at any time that day. If you have a wireless device, the transmission will be sent automatically. After your physician reviews your transmission, you will receive a postcard with your next transmission date.  Any Other Special Instructions Will Be Listed Below (If Applicable).  If you need a refill on your cardiac medications before your next appointment, please call your pharmacy.

## 2019-12-10 NOTE — Progress Notes (Signed)
HPI [name] returns today for follow-up. He is a very pleasant 79 year old man with a history of chronic atrial fibrillation and complete heart block, status post permanent pacemaker insertion.He has done well in the interim except he feels some increased fatigue and weakness and dsypnea with exertion, especially when walking. His balance is a little bit off as well. Unchanged from last year. Allergies  Allergen Reactions  . Lisinopril     "Very low blood pressure"     Current Outpatient Medications  Medication Sig Dispense Refill  . atorvastatin (LIPITOR) 10 MG tablet Take 10 mg by mouth daily.    . fluticasone (FLONASE) 50 MCG/ACT nasal spray Place 1 spray into both nostrils 2 (two) times daily as needed for allergies.    Marland Kitchen glipiZIDE (GLUCOTROL) 10 MG tablet Take 5 mg by mouth 2 (two) times daily before a meal.     . metFORMIN (GLUCOPHAGE) 500 MG tablet Take 1,000 mg by mouth 2 (two) times daily with a meal.     . Multiple Vitamin (MULTIVITAMIN WITH MINERALS) TABS tablet Take 1 tablet by mouth daily with supper.     . Omega-3 Fatty Acids (FISH OIL) 1000 MG CAPS Take 2,000 mg by mouth 2 (two) times daily.     . verapamil (CALAN-SR) 240 MG CR tablet TAKE 1 TABLET BY MOUTH AT BEDTIME 90 tablet 3  . warfarin (COUMADIN) 10 MG tablet 5-10 mg daily at 6 PM. He takes one tablet everyday except on Wednesdays. He takes half a tablet on that one day.     No current facility-administered medications for this visit.     Past Medical History:  Diagnosis Date  . Adenomatous colon polyp   . Arthritis    hands, knees  . Atrial fibrillation ([place])    failed cardioversion and passed, rate control and anticoagulation   . Diabetes mellitus without complication ([place])   . Dysrhythmia    Chronic Atrial Fibrillation- in and out  . ED (erectile dysfunction)    does not desire treatment  . Hyperlipidemia   . Hypertension   . Obesity, unspecified   . Unspecified sleep apnea   . Venous  insufficiency    with lower extremity venous varicosities    ROS:   All systems reviewed and negative except as noted in the HPI.   Past Surgical History:  Procedure Laterality Date  . COLONOSCOPY WITH PROPOFOL N/A 07/03/2015   Procedure: COLONOSCOPY WITH PROPOFOL;  Surgeon: Garlan Fair, MD;  Location: [facility];  Service: Endoscopy;  Laterality: N/A;  . HERNIA REPAIR     inguinal  . PACEMAKER INSERTION  09-22-2013   BSX single chamber pacemaker implanted by Dr Lovena Le for symptomatic bradycardia  . PERMANENT PACEMAKER INSERTION N/A 09/22/2013   Procedure: PERMANENT PACEMAKER INSERTION;  Surgeon: Evans Lance, MD;  Location: [facility] CATH LAB;  Service: Cardiovascular;  Laterality: N/A;  . VARICOSE VEIN SURGERY Bilateral      Family History  Problem Relation Age of Onset  . CAD Mother   . Breast cancer Mother   . Diabetes Mother   . CAD Father   . Prostate cancer Father   . Heart failure Brother   . CAD Brother      Social History   Socioeconomic History  . Marital status: Married    Spouse name: Not on file  . Number of children: Not on file  . Years of education: Not on file  . Highest education level: Not on  file  Occupational History  . Not on file  Tobacco Use  . Smoking status: Former Smoker    Quit date: 06/25/1985    Years since quitting: 34.4  . Smokeless tobacco: Never Used  Vaping Use  . Vaping Use: Never used  Substance and Sexual Activity  . Alcohol use: No  . Drug use: No  . Sexual activity: Not on file  Other Topics Concern  . Not on file  Social History Narrative  . Not on file   Social Determinants of Health   Financial Resource Strain:   . Difficulty of Paying Living Expenses: Not on file  Food Insecurity:   . Worried About Charity fundraiser in the Last Year: Not on file  . Ran Out of Food in the Last Year: Not on file  Transportation Needs:   . Lack of Transportation (Medical): Not on file  . Lack of Transportation (Non-Medical):  Not on file  Physical Activity:   . Days of Exercise per Week: Not on file  . Minutes of Exercise per Session: Not on file  Stress:   . Feeling of Stress : Not on file  Social Connections:   . Frequency of Communication with Friends and Family: Not on file  . Frequency of Social Gatherings with Friends and Family: Not on file  . Attends Religious Services: Not on file  . Active Member of Clubs or Organizations: Not on file  . Attends Archivist Meetings: Not on file  . Marital Status: Not on file  Intimate Partner Violence:   . Fear of Current or Ex-Partner: Not on file  . Emotionally Abused: Not on file  . Physically Abused: Not on file  . Sexually Abused: Not on file     BP 128/68   Pulse 60   Ht 6\' 1"  (1.854 m)   Wt 253 lb (114.8 kg)   SpO2 96%   BMI 33.38 kg/m   Physical Exam:  Well appearing NAD HEENT: Unremarkable Neck:  No JVD, no thyromegally Lymphatics:  No adenopathy Back:  No CVA tenderness Lungs:  Clear with no wheezes HEART:  Regular rate rhythm, no murmurs, no rubs, no clicks Abd:  soft, positive bowel sounds, no organomegally, no rebound, no guarding Ext:  2 plus pulses, no edema, no cyanosis, no clubbing Skin:  No rashes no nodules Neuro:  CN II through XII intact, motor grossly intact  EKG - Atrial fib with ventricular pacing  DEVICE  Normal device function.  See PaceArt for details.   Assess/Plan: 1. Atrial fib - his VR is controlled. No change in his meds. 2. CHB - he is asymptomatic, s/p PPM insertion. 3. PPM -his St. Jude VVI PM is working normally.  4. HTN - his bp is well controlled. No change in his meds.  [name],MD

## 2019-12-17 DIAGNOSIS — G4733 Obstructive sleep apnea (adult) (pediatric): Secondary | ICD-10-CM | POA: Diagnosis not present

## 2019-12-18 ENCOUNTER — Ambulatory Visit: Payer: Medicare HMO | Attending: Internal Medicine

## 2019-12-18 DIAGNOSIS — Z23 Encounter for immunization: Secondary | ICD-10-CM

## 2019-12-18 NOTE — Progress Notes (Signed)
   Covid-19 Vaccination Clinic  Name:  Patrick Lozano    MRN: [id number] DOB: 06-14-1940  12/18/2019  Patrick Lozano was observed post Covid-19 immunization for 15 minutes without incident. He was provided with Vaccine Information Sheet and instruction to access the V-Safe system.   Patrick Lozano was instructed to call 911 with any severe reactions post vaccine: Marland Kitchen Difficulty breathing  . Swelling of face and throat  . A fast heartbeat  . A bad rash all over body  . Dizziness and weakness

## 2019-12-31 DIAGNOSIS — Z7901 Long term (current) use of anticoagulants: Secondary | ICD-10-CM | POA: Diagnosis not present

## 2020-01-17 DIAGNOSIS — G4733 Obstructive sleep apnea (adult) (pediatric): Secondary | ICD-10-CM | POA: Diagnosis not present

## 2020-01-21 DIAGNOSIS — Z7901 Long term (current) use of anticoagulants: Secondary | ICD-10-CM | POA: Diagnosis not present

## 2020-02-01 DIAGNOSIS — H401131 Primary open-angle glaucoma, bilateral, mild stage: Secondary | ICD-10-CM | POA: Diagnosis not present

## 2020-02-02 ENCOUNTER — Ambulatory Visit (INDEPENDENT_AMBULATORY_CARE_PROVIDER_SITE_OTHER): Payer: Medicare HMO

## 2020-02-02 DIAGNOSIS — I482 Chronic atrial fibrillation, unspecified: Secondary | ICD-10-CM

## 2020-02-02 LAB — CUP PACEART REMOTE DEVICE CHECK
Battery Remaining Longevity: 42 mo
Battery Remaining Percentage: 53 %
Brady Statistic RV Percent Paced: 100 %
Date Time Interrogation Session: 20211215030200
Implantable Lead Implant Date: 20150805
Implantable Lead Location: 753860
Implantable Lead Model: 4137
Implantable Lead Serial Number: 29478499
Implantable Pulse Generator Implant Date: 20150805
Lead Channel Impedance Value: 771 Ohm
Lead Channel Pacing Threshold Amplitude: 0.9 V
Lead Channel Pacing Threshold Pulse Width: 0.4 ms
Lead Channel Setting Pacing Amplitude: 1.3 V
Lead Channel Setting Pacing Pulse Width: 0.4 ms
Lead Channel Setting Sensing Sensitivity: 2.5 mV
Pulse Gen Serial Number: 119862

## 2020-02-08 DIAGNOSIS — L84 Corns and callosities: Secondary | ICD-10-CM | POA: Diagnosis not present

## 2020-02-08 DIAGNOSIS — E1142 Type 2 diabetes mellitus with diabetic polyneuropathy: Secondary | ICD-10-CM | POA: Diagnosis not present

## 2020-02-08 DIAGNOSIS — L603 Nail dystrophy: Secondary | ICD-10-CM | POA: Diagnosis not present

## 2020-02-08 DIAGNOSIS — M79674 Pain in right toe(s): Secondary | ICD-10-CM | POA: Diagnosis not present

## 2020-02-16 DIAGNOSIS — G4733 Obstructive sleep apnea (adult) (pediatric): Secondary | ICD-10-CM | POA: Diagnosis not present

## 2020-02-16 NOTE — Progress Notes (Signed)
Remote pacemaker transmission.   

## 2020-02-24 DIAGNOSIS — Z7901 Long term (current) use of anticoagulants: Secondary | ICD-10-CM | POA: Diagnosis not present

## 2020-03-14 DIAGNOSIS — E1169 Type 2 diabetes mellitus with other specified complication: Secondary | ICD-10-CM | POA: Diagnosis not present

## 2020-03-14 DIAGNOSIS — Z7984 Long term (current) use of oral hypoglycemic drugs: Secondary | ICD-10-CM | POA: Diagnosis not present

## 2020-03-14 DIAGNOSIS — I482 Chronic atrial fibrillation, unspecified: Secondary | ICD-10-CM | POA: Diagnosis not present

## 2020-03-14 DIAGNOSIS — D6869 Other thrombophilia: Secondary | ICD-10-CM | POA: Diagnosis not present

## 2020-03-14 DIAGNOSIS — L989 Disorder of the skin and subcutaneous tissue, unspecified: Secondary | ICD-10-CM | POA: Diagnosis not present

## 2020-04-05 DIAGNOSIS — D485 Neoplasm of uncertain behavior of skin: Secondary | ICD-10-CM | POA: Diagnosis not present

## 2020-04-05 DIAGNOSIS — L570 Actinic keratosis: Secondary | ICD-10-CM | POA: Diagnosis not present

## 2020-04-05 DIAGNOSIS — D0462 Carcinoma in situ of skin of left upper limb, including shoulder: Secondary | ICD-10-CM | POA: Diagnosis not present

## 2020-04-14 DIAGNOSIS — G4700 Insomnia, unspecified: Secondary | ICD-10-CM | POA: Diagnosis not present

## 2020-04-14 DIAGNOSIS — E782 Mixed hyperlipidemia: Secondary | ICD-10-CM | POA: Diagnosis not present

## 2020-04-14 DIAGNOSIS — M154 Erosive (osteo)arthritis: Secondary | ICD-10-CM | POA: Diagnosis not present

## 2020-04-14 DIAGNOSIS — E1169 Type 2 diabetes mellitus with other specified complication: Secondary | ICD-10-CM | POA: Diagnosis not present

## 2020-04-19 DIAGNOSIS — Z7901 Long term (current) use of anticoagulants: Secondary | ICD-10-CM | POA: Diagnosis not present

## 2020-04-28 DIAGNOSIS — Z7901 Long term (current) use of anticoagulants: Secondary | ICD-10-CM | POA: Diagnosis not present

## 2020-05-01 DIAGNOSIS — D0462 Carcinoma in situ of skin of left upper limb, including shoulder: Secondary | ICD-10-CM | POA: Diagnosis not present

## 2020-05-02 DIAGNOSIS — E1142 Type 2 diabetes mellitus with diabetic polyneuropathy: Secondary | ICD-10-CM | POA: Diagnosis not present

## 2020-05-02 DIAGNOSIS — L603 Nail dystrophy: Secondary | ICD-10-CM | POA: Diagnosis not present

## 2020-05-02 DIAGNOSIS — L84 Corns and callosities: Secondary | ICD-10-CM | POA: Diagnosis not present

## 2020-05-02 DIAGNOSIS — H401131 Primary open-angle glaucoma, bilateral, mild stage: Secondary | ICD-10-CM | POA: Diagnosis not present

## 2020-05-02 DIAGNOSIS — M79674 Pain in right toe(s): Secondary | ICD-10-CM | POA: Diagnosis not present

## 2020-05-03 ENCOUNTER — Ambulatory Visit (INDEPENDENT_AMBULATORY_CARE_PROVIDER_SITE_OTHER): Payer: Medicare HMO

## 2020-05-03 DIAGNOSIS — I442 Atrioventricular block, complete: Secondary | ICD-10-CM | POA: Diagnosis not present

## 2020-05-04 LAB — CUP PACEART REMOTE DEVICE CHECK
Battery Remaining Longevity: 42 mo
Battery Remaining Percentage: 51 %
Brady Statistic RV Percent Paced: 100 %
Date Time Interrogation Session: 20220316030100
Implantable Lead Implant Date: 20150805
Implantable Lead Location: 753860
Implantable Lead Model: 4137
Implantable Lead Serial Number: 29478499
Implantable Pulse Generator Implant Date: 20150805
Lead Channel Impedance Value: 786 Ohm
Lead Channel Pacing Threshold Amplitude: 0.9 V
Lead Channel Pacing Threshold Pulse Width: 0.4 ms
Lead Channel Setting Pacing Amplitude: 1.4 V
Lead Channel Setting Pacing Pulse Width: 0.4 ms
Lead Channel Setting Sensing Sensitivity: 2.5 mV
Pulse Gen Serial Number: 119862

## 2020-05-11 NOTE — Progress Notes (Signed)
Remote pacemaker transmission.   

## 2020-05-15 DIAGNOSIS — M154 Erosive (osteo)arthritis: Secondary | ICD-10-CM | POA: Diagnosis not present

## 2020-05-15 DIAGNOSIS — E1169 Type 2 diabetes mellitus with other specified complication: Secondary | ICD-10-CM | POA: Diagnosis not present

## 2020-05-15 DIAGNOSIS — J069 Acute upper respiratory infection, unspecified: Secondary | ICD-10-CM | POA: Diagnosis not present

## 2020-05-15 DIAGNOSIS — N3001 Acute cystitis with hematuria: Secondary | ICD-10-CM | POA: Diagnosis not present

## 2020-05-15 DIAGNOSIS — R300 Dysuria: Secondary | ICD-10-CM | POA: Diagnosis not present

## 2020-05-15 DIAGNOSIS — Z20822 Contact with and (suspected) exposure to covid-19: Secondary | ICD-10-CM | POA: Diagnosis not present

## 2020-05-15 DIAGNOSIS — E782 Mixed hyperlipidemia: Secondary | ICD-10-CM | POA: Diagnosis not present

## 2020-05-15 DIAGNOSIS — G4700 Insomnia, unspecified: Secondary | ICD-10-CM | POA: Diagnosis not present

## 2020-05-23 DIAGNOSIS — N390 Urinary tract infection, site not specified: Secondary | ICD-10-CM | POA: Diagnosis not present

## 2020-05-30 DIAGNOSIS — Z7901 Long term (current) use of anticoagulants: Secondary | ICD-10-CM | POA: Diagnosis not present

## 2020-06-16 DIAGNOSIS — G4733 Obstructive sleep apnea (adult) (pediatric): Secondary | ICD-10-CM | POA: Diagnosis not present

## 2020-07-10 ENCOUNTER — Ambulatory Visit: Payer: Medicare HMO | Admitting: Interventional Cardiology

## 2020-07-11 DIAGNOSIS — E1142 Type 2 diabetes mellitus with diabetic polyneuropathy: Secondary | ICD-10-CM | POA: Diagnosis not present

## 2020-07-11 DIAGNOSIS — L84 Corns and callosities: Secondary | ICD-10-CM | POA: Diagnosis not present

## 2020-07-11 DIAGNOSIS — L603 Nail dystrophy: Secondary | ICD-10-CM | POA: Diagnosis not present

## 2020-07-11 DIAGNOSIS — M79674 Pain in right toe(s): Secondary | ICD-10-CM | POA: Diagnosis not present

## 2020-07-14 DIAGNOSIS — G4733 Obstructive sleep apnea (adult) (pediatric): Secondary | ICD-10-CM | POA: Diagnosis not present

## 2020-07-14 DIAGNOSIS — I482 Chronic atrial fibrillation, unspecified: Secondary | ICD-10-CM | POA: Diagnosis not present

## 2020-07-14 DIAGNOSIS — Z7901 Long term (current) use of anticoagulants: Secondary | ICD-10-CM | POA: Diagnosis not present

## 2020-07-14 DIAGNOSIS — Z1389 Encounter for screening for other disorder: Secondary | ICD-10-CM | POA: Diagnosis not present

## 2020-07-14 DIAGNOSIS — D6869 Other thrombophilia: Secondary | ICD-10-CM | POA: Diagnosis not present

## 2020-07-14 DIAGNOSIS — E782 Mixed hyperlipidemia: Secondary | ICD-10-CM | POA: Diagnosis not present

## 2020-07-14 DIAGNOSIS — Z0000 Encounter for general adult medical examination without abnormal findings: Secondary | ICD-10-CM | POA: Diagnosis not present

## 2020-07-14 DIAGNOSIS — E1169 Type 2 diabetes mellitus with other specified complication: Secondary | ICD-10-CM | POA: Diagnosis not present

## 2020-07-14 DIAGNOSIS — Z7984 Long term (current) use of oral hypoglycemic drugs: Secondary | ICD-10-CM | POA: Diagnosis not present

## 2020-07-16 DIAGNOSIS — G4733 Obstructive sleep apnea (adult) (pediatric): Secondary | ICD-10-CM | POA: Diagnosis not present

## 2020-07-18 DIAGNOSIS — G4700 Insomnia, unspecified: Secondary | ICD-10-CM | POA: Diagnosis not present

## 2020-07-18 DIAGNOSIS — E782 Mixed hyperlipidemia: Secondary | ICD-10-CM | POA: Diagnosis not present

## 2020-07-18 DIAGNOSIS — E1169 Type 2 diabetes mellitus with other specified complication: Secondary | ICD-10-CM | POA: Diagnosis not present

## 2020-07-18 DIAGNOSIS — M154 Erosive (osteo)arthritis: Secondary | ICD-10-CM | POA: Diagnosis not present

## 2020-07-21 ENCOUNTER — Ambulatory Visit: Payer: Medicare HMO | Admitting: Interventional Cardiology

## 2020-08-02 ENCOUNTER — Ambulatory Visit (INDEPENDENT_AMBULATORY_CARE_PROVIDER_SITE_OTHER): Payer: Medicare HMO

## 2020-08-02 DIAGNOSIS — I442 Atrioventricular block, complete: Secondary | ICD-10-CM | POA: Diagnosis not present

## 2020-08-03 LAB — CUP PACEART REMOTE DEVICE CHECK
Battery Remaining Longevity: 42 mo
Battery Remaining Percentage: 47 %
Brady Statistic RV Percent Paced: 100 %
Date Time Interrogation Session: 20220615032700
Implantable Lead Implant Date: 20150805
Implantable Lead Location: 753860
Implantable Lead Model: 4137
Implantable Lead Serial Number: 29478499
Implantable Pulse Generator Implant Date: 20150805
Lead Channel Impedance Value: 749 Ohm
Lead Channel Pacing Threshold Amplitude: 1 V
Lead Channel Pacing Threshold Pulse Width: 0.4 ms
Lead Channel Setting Pacing Amplitude: 1.4 V
Lead Channel Setting Pacing Pulse Width: 0.4 ms
Lead Channel Setting Sensing Sensitivity: 2.5 mV
Pulse Gen Serial Number: 119862

## 2020-08-07 ENCOUNTER — Other Ambulatory Visit: Payer: Self-pay | Admitting: Interventional Cardiology

## 2020-08-08 DIAGNOSIS — M154 Erosive (osteo)arthritis: Secondary | ICD-10-CM | POA: Diagnosis not present

## 2020-08-08 DIAGNOSIS — G4700 Insomnia, unspecified: Secondary | ICD-10-CM | POA: Diagnosis not present

## 2020-08-08 DIAGNOSIS — E1169 Type 2 diabetes mellitus with other specified complication: Secondary | ICD-10-CM | POA: Diagnosis not present

## 2020-08-08 DIAGNOSIS — E782 Mixed hyperlipidemia: Secondary | ICD-10-CM | POA: Diagnosis not present

## 2020-08-16 DIAGNOSIS — G4733 Obstructive sleep apnea (adult) (pediatric): Secondary | ICD-10-CM | POA: Diagnosis not present

## 2020-08-22 DIAGNOSIS — H401131 Primary open-angle glaucoma, bilateral, mild stage: Secondary | ICD-10-CM | POA: Diagnosis not present

## 2020-08-22 DIAGNOSIS — E119 Type 2 diabetes mellitus without complications: Secondary | ICD-10-CM | POA: Diagnosis not present

## 2020-08-22 DIAGNOSIS — Z961 Presence of intraocular lens: Secondary | ICD-10-CM | POA: Diagnosis not present

## 2020-08-22 DIAGNOSIS — H5203 Hypermetropia, bilateral: Secondary | ICD-10-CM | POA: Diagnosis not present

## 2020-08-22 DIAGNOSIS — H52203 Unspecified astigmatism, bilateral: Secondary | ICD-10-CM | POA: Diagnosis not present

## 2020-08-22 DIAGNOSIS — H524 Presbyopia: Secondary | ICD-10-CM | POA: Diagnosis not present

## 2020-08-22 DIAGNOSIS — Z7984 Long term (current) use of oral hypoglycemic drugs: Secondary | ICD-10-CM | POA: Diagnosis not present

## 2020-08-24 NOTE — Progress Notes (Signed)
Remote pacemaker transmission.   

## 2020-08-29 DIAGNOSIS — Z7901 Long term (current) use of anticoagulants: Secondary | ICD-10-CM | POA: Diagnosis not present

## 2020-09-19 DIAGNOSIS — L603 Nail dystrophy: Secondary | ICD-10-CM | POA: Diagnosis not present

## 2020-09-19 DIAGNOSIS — L84 Corns and callosities: Secondary | ICD-10-CM | POA: Diagnosis not present

## 2020-09-19 DIAGNOSIS — E1142 Type 2 diabetes mellitus with diabetic polyneuropathy: Secondary | ICD-10-CM | POA: Diagnosis not present

## 2020-09-19 DIAGNOSIS — M79674 Pain in right toe(s): Secondary | ICD-10-CM | POA: Diagnosis not present

## 2020-10-11 DIAGNOSIS — M154 Erosive (osteo)arthritis: Secondary | ICD-10-CM | POA: Diagnosis not present

## 2020-10-11 DIAGNOSIS — E1169 Type 2 diabetes mellitus with other specified complication: Secondary | ICD-10-CM | POA: Diagnosis not present

## 2020-10-11 DIAGNOSIS — Z7901 Long term (current) use of anticoagulants: Secondary | ICD-10-CM | POA: Diagnosis not present

## 2020-10-11 DIAGNOSIS — E782 Mixed hyperlipidemia: Secondary | ICD-10-CM | POA: Diagnosis not present

## 2020-10-11 DIAGNOSIS — G4700 Insomnia, unspecified: Secondary | ICD-10-CM | POA: Diagnosis not present

## 2020-10-11 NOTE — Progress Notes (Signed)
Cardiology Office Note   Date:  10/12/2020   ID:  Patrick Lozano, Patrick Lozano October 25, 1940, MRN [id number]  PCP:  Lavone Orn, MD    No chief complaint on file.  AFib  Wt Readings from Last 3 Encounters:  10/12/20 244 lb (110.7 kg)  12/10/19 253 lb (114.8 kg)  07/05/19 252 lb (114.3 kg)       History of Present Illness: Patrick Lozano is a 80 y.o. male  who has atrial fibrillation. He had a procedure for his varicose veins a many years ago. His legs feels somewhat better now. Pacer placed in August 2015 for symptomatic bradycardia.  Felt much better after this.     In the past, BP readings have been low at home at times after mowing the lawn.  No syncope or lightheadedness reported, since pacer was placed.  BP was low after mowing the lawn so lisinopril was stopped.    In the past, it was noted: "[place]  Exercise limited by knee pain.  He saw rheumatologist and has osteoarthritis.   Gets benefit from CBD oil on his knuckles."  Denies : Chest pain. Dizziness. Leg edema. Nitroglycerin use. Orthopnea. Palpitations. Paroxysmal nocturnal dyspnea. Shortness of breath. Syncope.    He had a UTI in the past after Jardiance.  Joint pain has decreased.  He has had more balance issues.  He tries to be more careful.     Past Medical History:  Diagnosis Date   Adenomatous colon polyp    Arthritis    hands, knees   Atrial fibrillation (HCC)    failed cardioversion and passed, rate control and anticoagulation    Diabetes mellitus without complication (HCC)    Dysrhythmia    Chronic Atrial Fibrillation- in and out   ED (erectile dysfunction)    does not desire treatment   Hyperlipidemia    Hypertension    Obesity, unspecified    Unspecified sleep apnea    Venous insufficiency    with lower extremity venous varicosities    Past Surgical History:  Procedure Laterality Date   COLONOSCOPY WITH PROPOFOL N/A 07/03/2015   Procedure: COLONOSCOPY WITH PROPOFOL;  Surgeon:  Garlan Fair, MD;  Location: [facility];  Service: Endoscopy;  Laterality: N/A;   HERNIA REPAIR     inguinal   PACEMAKER INSERTION  09-22-2013   BSX single chamber pacemaker implanted by Dr Lovena Le for symptomatic bradycardia   PERMANENT PACEMAKER INSERTION N/A 09/22/2013   Procedure: PERMANENT PACEMAKER INSERTION;  Surgeon: Evans Lance, MD;  Location: [facility] CATH LAB;  Service: Cardiovascular;  Laterality: N/A;   VARICOSE VEIN SURGERY Bilateral      Current Outpatient Medications  Medication Sig Dispense Refill   atorvastatin (LIPITOR) 10 MG tablet Take 10 mg by mouth daily.     Cholecalciferol (VITAMIN D3) 50 MCG (2000 UT) TABS Take by mouth.     fluticasone (FLONASE) 50 MCG/ACT nasal spray Place 1 spray into both nostrils 2 (two) times daily as needed for allergies.     glipiZIDE (GLUCOTROL) 10 MG tablet Take 5 mg by mouth 2 (two) times daily before a meal.      metFORMIN (GLUCOPHAGE) 500 MG tablet Take 1,000 mg by mouth 2 (two) times daily with a meal.      Multiple Vitamin (MULTIVITAMIN WITH MINERALS) TABS tablet Take 1 tablet by mouth daily with supper.      Omega-3 Fatty Acids (FISH OIL) 1000 MG CAPS Take 2,000 mg by mouth 2 (  two) times daily.      verapamil (CALAN-SR) 240 MG CR tablet Take 1 tablet (240 mg total) by mouth at bedtime. Please keep upcoming appointment with Dr Irish Lack for further refills 90 tablet 0   warfarin (COUMADIN) 10 MG tablet 5-10 mg daily at 6 PM. He takes one tablet everyday except on Wednesdays. He takes half a tablet on that one day.     No current facility-administered medications for this visit.    Allergies:   Lisinopril    Social History:  The patient  reports that he quit smoking about 35 years ago. His smoking use included cigarettes. He has never used smokeless tobacco. He reports that he does not drink alcohol and does not use drugs.   Family History:  The patient's family history includes Breast cancer in his mother; CAD in his brother,  father, and mother; Diabetes in his mother; Heart failure in his brother; Prostate cancer in his father.    ROS:  Please see the history of present illness.   Otherwise, review of systems are positive for stress from wife's illness; balance issues.   All other systems are reviewed and negative.    PHYSICAL EXAM: VS:  BP 130/62   Pulse 81   Ht '6\' 1"'$  (1.854 m)   Wt 244 lb (110.7 kg)   SpO2 95%   BMI 32.19 kg/m  , BMI Body mass index is 32.19 kg/m. GEN: Well nourished, well developed, in no acute distress HEENT: normal Neck: no JVD, carotid bruits, or masses Cardiac: RRR; no murmurs, rubs, or gallops,no edema  Respiratory:  clear to auscultation bilaterally, normal work of breathing GI: soft, nontender, nondistended, + BS MS: no deformity or atrophy Skin: warm and dry, no rash Neuro:  Strength and sensation are intact Psych: euthymic mood, full affect   EKG:   The ekg ordered 10/21 demonstrates V paced rhythm   Recent Labs: No results found for requested labs within last 8760 hours.   Lipid Panel    Component Value Date/Time   CHOL 119 11/18/2013 1022   TRIG 98.0 11/18/2013 1022   HDL 32.70 (L) 11/18/2013 1022   CHOLHDL 4 11/18/2013 1022   VLDL 19.6 11/18/2013 1022   LDLCALC 67 11/18/2013 1022     Other studies Reviewed: Additional studies/ records that were reviewed today with results demonstrating: labs reviewed.   ASSESSMENT AND PLAN:  AFib: Rate controlled. Warfarin for stroke prevention.  INR 2.2 yesterday.   Hyperlipidemia: LDL 49 in 06/2020.  Avoid processed foods.  S/p pacer: follows with EP.  DM: A1C 7.6 in 06/2020. No hypoglycemic episodes. Hypertensive heart disease: The current medical regimen is effective;  continue present plan and medications. Anticoagulated: No bleeding issues.    Current medicines are reviewed at length with the patient today.  The patient concerns regarding his medicines were addressed.  The following changes have been made:   No change  Labs/ tests ordered today include:  No orders of the defined types were placed in this encounter.   Recommend 150 minutes/week of aerobic exercise Low fat, low carb, high fiber diet recommended  Disposition:   FU in May 2023   Signed, Larae Grooms, MD  10/12/2020 9:25 AM    [address], [place], [place]  [address] Phone: [phone]; Fax: [phone]

## 2020-10-12 ENCOUNTER — Encounter: Payer: Self-pay | Admitting: Interventional Cardiology

## 2020-10-12 ENCOUNTER — Ambulatory Visit: Payer: Medicare HMO | Admitting: Interventional Cardiology

## 2020-10-12 ENCOUNTER — Other Ambulatory Visit: Payer: Self-pay

## 2020-10-12 VITALS — BP 130/62 | HR 81 | Ht 73.0 in | Wt 244.0 lb

## 2020-10-12 DIAGNOSIS — Z950 Presence of cardiac pacemaker: Secondary | ICD-10-CM | POA: Diagnosis not present

## 2020-10-12 DIAGNOSIS — I119 Hypertensive heart disease without heart failure: Secondary | ICD-10-CM | POA: Diagnosis not present

## 2020-10-12 DIAGNOSIS — I482 Chronic atrial fibrillation, unspecified: Secondary | ICD-10-CM | POA: Diagnosis not present

## 2020-10-12 DIAGNOSIS — E119 Type 2 diabetes mellitus without complications: Secondary | ICD-10-CM

## 2020-10-12 DIAGNOSIS — E782 Mixed hyperlipidemia: Secondary | ICD-10-CM

## 2020-10-12 DIAGNOSIS — Z7901 Long term (current) use of anticoagulants: Secondary | ICD-10-CM

## 2020-10-12 NOTE — Patient Instructions (Signed)
Medication Instructions:  Your physician recommends that you continue on your current medications as directed. Please refer to the Current Medication list given to you today.  *If you need a refill on your cardiac medications before your next appointment, please call your pharmacy*   Lab Work: NONE If you have labs (blood work) drawn today and your tests are completely normal, you will receive your results only by: [place] (if you have MyChart) OR A paper copy in the mail If you have any lab test that is abnormal or we need to change your treatment, we will call you to review the results.   Testing/Procedures: NONE   Follow-Up: At [facility], you and your health needs are our priority.  As part of our continuing mission to provide you with exceptional heart care, we have created designated Provider Care Teams.  These Care Teams include your primary Cardiologist (physician) and Advanced Practice Providers (APPs -  Physician Assistants and Nurse Practitioners) who all work together to provide you with the care you need, when you need it.    Your next appointment:   9 month(s)  The format for your next appointment:   In Person  Provider:   You may see Larae Grooms, MD or one of the following Advanced Practice Providers on your designated Care Team:   Melina Copa, PA-C Ermalinda Barrios, PA-C

## 2020-10-27 DIAGNOSIS — I482 Chronic atrial fibrillation, unspecified: Secondary | ICD-10-CM | POA: Diagnosis not present

## 2020-10-27 DIAGNOSIS — G4733 Obstructive sleep apnea (adult) (pediatric): Secondary | ICD-10-CM | POA: Diagnosis not present

## 2020-10-27 DIAGNOSIS — Z23 Encounter for immunization: Secondary | ICD-10-CM | POA: Diagnosis not present

## 2020-10-27 DIAGNOSIS — E1169 Type 2 diabetes mellitus with other specified complication: Secondary | ICD-10-CM | POA: Diagnosis not present

## 2020-10-27 DIAGNOSIS — E782 Mixed hyperlipidemia: Secondary | ICD-10-CM | POA: Diagnosis not present

## 2020-11-01 ENCOUNTER — Ambulatory Visit (INDEPENDENT_AMBULATORY_CARE_PROVIDER_SITE_OTHER): Payer: Medicare HMO

## 2020-11-01 DIAGNOSIS — I442 Atrioventricular block, complete: Secondary | ICD-10-CM

## 2020-11-01 LAB — CUP PACEART REMOTE DEVICE CHECK
Battery Remaining Longevity: 36 mo
Battery Remaining Percentage: 44 %
Brady Statistic RV Percent Paced: 100 %
Date Time Interrogation Session: 20220914030100
Implantable Lead Implant Date: 20150805
Implantable Lead Location: 753860
Implantable Lead Model: 4137
Implantable Lead Serial Number: 29478499
Implantable Pulse Generator Implant Date: 20150805
Lead Channel Impedance Value: 754 Ohm
Lead Channel Pacing Threshold Amplitude: 0.9 V
Lead Channel Pacing Threshold Pulse Width: 0.4 ms
Lead Channel Setting Pacing Amplitude: 1.4 V
Lead Channel Setting Pacing Pulse Width: 0.4 ms
Lead Channel Setting Sensing Sensitivity: 2.5 mV
Pulse Gen Serial Number: 119862

## 2020-11-08 NOTE — Progress Notes (Signed)
Remote pacemaker transmission.   

## 2020-11-15 ENCOUNTER — Other Ambulatory Visit: Payer: Self-pay | Admitting: Interventional Cardiology

## 2020-11-21 DIAGNOSIS — Z7901 Long term (current) use of anticoagulants: Secondary | ICD-10-CM | POA: Diagnosis not present

## 2020-11-22 ENCOUNTER — Other Ambulatory Visit (HOSPITAL_BASED_OUTPATIENT_CLINIC_OR_DEPARTMENT_OTHER): Payer: Self-pay

## 2020-11-22 ENCOUNTER — Ambulatory Visit: Payer: Medicare HMO | Attending: Internal Medicine

## 2020-11-22 DIAGNOSIS — Z23 Encounter for immunization: Secondary | ICD-10-CM

## 2020-11-22 MED ORDER — PFIZER COVID-19 VAC BIVALENT 30 MCG/0.3ML IM SUSP
INTRAMUSCULAR | 0 refills | Status: AC
  Filled 2020-11-22: qty 0.3, 1d supply, fill #0

## 2020-11-22 NOTE — Progress Notes (Signed)
   Covid-19 Vaccination Clinic  Name:  Patrick Lozano    MRN: [id number] DOB: Jul 04, 1940  11/22/2020  [name] was observed post Covid-19 immunization for 15 minutes without incident. He was provided with Vaccine Information Sheet and instruction to access the V-Safe system.   [name] was instructed to call 911 with any severe reactions post vaccine: Difficulty breathing  Swelling of face and throat  A fast heartbeat  A bad rash all over body  Dizziness and weakness

## 2020-11-23 DIAGNOSIS — H401131 Primary open-angle glaucoma, bilateral, mild stage: Secondary | ICD-10-CM | POA: Diagnosis not present

## 2020-11-24 DIAGNOSIS — M1990 Unspecified osteoarthritis, unspecified site: Secondary | ICD-10-CM | POA: Diagnosis not present

## 2020-11-24 DIAGNOSIS — I951 Orthostatic hypotension: Secondary | ICD-10-CM | POA: Diagnosis not present

## 2020-11-24 DIAGNOSIS — H409 Unspecified glaucoma: Secondary | ICD-10-CM | POA: Diagnosis not present

## 2020-11-24 DIAGNOSIS — N529 Male erectile dysfunction, unspecified: Secondary | ICD-10-CM | POA: Diagnosis not present

## 2020-11-24 DIAGNOSIS — Z008 Encounter for other general examination: Secondary | ICD-10-CM | POA: Diagnosis not present

## 2020-11-24 DIAGNOSIS — E669 Obesity, unspecified: Secondary | ICD-10-CM | POA: Diagnosis not present

## 2020-11-24 DIAGNOSIS — E785 Hyperlipidemia, unspecified: Secondary | ICD-10-CM | POA: Diagnosis not present

## 2020-11-24 DIAGNOSIS — E1140 Type 2 diabetes mellitus with diabetic neuropathy, unspecified: Secondary | ICD-10-CM | POA: Diagnosis not present

## 2020-11-24 DIAGNOSIS — D6869 Other thrombophilia: Secondary | ICD-10-CM | POA: Diagnosis not present

## 2020-11-24 DIAGNOSIS — E1136 Type 2 diabetes mellitus with diabetic cataract: Secondary | ICD-10-CM | POA: Diagnosis not present

## 2020-11-24 DIAGNOSIS — I2510 Atherosclerotic heart disease of native coronary artery without angina pectoris: Secondary | ICD-10-CM | POA: Diagnosis not present

## 2020-11-24 DIAGNOSIS — I4891 Unspecified atrial fibrillation: Secondary | ICD-10-CM | POA: Diagnosis not present

## 2020-11-24 DIAGNOSIS — I10 Essential (primary) hypertension: Secondary | ICD-10-CM | POA: Diagnosis not present

## 2020-12-05 DIAGNOSIS — L603 Nail dystrophy: Secondary | ICD-10-CM | POA: Diagnosis not present

## 2020-12-05 DIAGNOSIS — I739 Peripheral vascular disease, unspecified: Secondary | ICD-10-CM | POA: Diagnosis not present

## 2020-12-05 DIAGNOSIS — E1151 Type 2 diabetes mellitus with diabetic peripheral angiopathy without gangrene: Secondary | ICD-10-CM | POA: Diagnosis not present

## 2020-12-05 DIAGNOSIS — E1142 Type 2 diabetes mellitus with diabetic polyneuropathy: Secondary | ICD-10-CM | POA: Diagnosis not present

## 2020-12-05 DIAGNOSIS — L84 Corns and callosities: Secondary | ICD-10-CM | POA: Diagnosis not present

## 2020-12-05 DIAGNOSIS — B351 Tinea unguium: Secondary | ICD-10-CM | POA: Diagnosis not present

## 2020-12-08 DIAGNOSIS — G4733 Obstructive sleep apnea (adult) (pediatric): Secondary | ICD-10-CM | POA: Diagnosis not present

## 2020-12-18 DIAGNOSIS — Z7901 Long term (current) use of anticoagulants: Secondary | ICD-10-CM | POA: Diagnosis not present

## 2020-12-22 ENCOUNTER — Other Ambulatory Visit: Payer: Self-pay

## 2020-12-22 ENCOUNTER — Encounter (HOSPITAL_BASED_OUTPATIENT_CLINIC_OR_DEPARTMENT_OTHER): Payer: Self-pay | Admitting: *Deleted

## 2020-12-22 ENCOUNTER — Emergency Department (HOSPITAL_BASED_OUTPATIENT_CLINIC_OR_DEPARTMENT_OTHER)
Admission: EM | Admit: 2020-12-22 | Discharge: 2020-12-22 | Disposition: A | Discharge status: Home / Self Care | Payer: Medicare HMO | Attending: Emergency Medicine | Admitting: Emergency Medicine

## 2020-12-22 DIAGNOSIS — Z950 Presence of cardiac pacemaker: Secondary | ICD-10-CM | POA: Diagnosis not present

## 2020-12-22 DIAGNOSIS — R2232 Localized swelling, mass and lump, left upper limb: Secondary | ICD-10-CM | POA: Diagnosis not present

## 2020-12-22 DIAGNOSIS — Z7901 Long term (current) use of anticoagulants: Secondary | ICD-10-CM | POA: Diagnosis not present

## 2020-12-22 DIAGNOSIS — I4891 Unspecified atrial fibrillation: Secondary | ICD-10-CM | POA: Diagnosis not present

## 2020-12-22 DIAGNOSIS — Z7984 Long term (current) use of oral hypoglycemic drugs: Secondary | ICD-10-CM | POA: Diagnosis not present

## 2020-12-22 DIAGNOSIS — E1169 Type 2 diabetes mellitus with other specified complication: Secondary | ICD-10-CM | POA: Diagnosis not present

## 2020-12-22 DIAGNOSIS — Z79899 Other long term (current) drug therapy: Secondary | ICD-10-CM | POA: Diagnosis not present

## 2020-12-22 DIAGNOSIS — L02414 Cutaneous abscess of left upper limb: Secondary | ICD-10-CM | POA: Diagnosis not present

## 2020-12-22 DIAGNOSIS — Z87891 Personal history of nicotine dependence: Secondary | ICD-10-CM | POA: Diagnosis not present

## 2020-12-22 DIAGNOSIS — I10 Essential (primary) hypertension: Secondary | ICD-10-CM | POA: Insufficient documentation

## 2020-12-22 LAB — CBG MONITORING, ED: Glucose-Capillary: 106 mg/dL — ABNORMAL HIGH (ref 70–99)

## 2020-12-22 MED ORDER — CEPHALEXIN 500 MG PO CAPS: 500.0000 mg | ORAL_CAPSULE | Freq: Three times a day (TID) | ORAL | 0 refills | Status: AC

## 2020-12-22 MED ORDER — LIDOCAINE HCL (PF) 1 % IJ SOLN
5.0000 mL | Freq: Once | INTRAMUSCULAR | Status: AC
  Administered 2020-12-22: 5 mL
  Filled 2020-12-22: qty 5

## 2020-12-22 NOTE — ED Triage Notes (Signed)
Abscess on upper left arm for little over a week, inflamed and swollen

## 2020-12-22 NOTE — ED Provider Notes (Signed)
[facility] EMERGENCY DEPT Provider Note   CSN: [id number] Arrival date & time: 12/22/20  1444     History Chief Complaint  Patient presents with   Abscess    Patrick Lozano is a 80 y.o. male.  Patient presents ER chief complaint of left shoulder swelling tenderness and pain on the lateral aspect of the left shoulder.  Pain is described as mild, sharp, nonradiating.  Symptoms have been there for about 10 days.  Denies any fevers.  No pain with range of motion of the shoulder he states.  Otherwise no vomiting cough or diarrhea.      Past Medical History:  Diagnosis Date   Adenomatous colon polyp    Arthritis    hands, knees   Atrial fibrillation (HCC)    failed cardioversion and passed, rate control and anticoagulation    Diabetes mellitus without complication (HCC)    Dysrhythmia    Chronic Atrial Fibrillation- in and out   ED (erectile dysfunction)    does not desire treatment   Hyperlipidemia    Hypertension    Obesity, unspecified    Unspecified sleep apnea    Venous insufficiency    with lower extremity venous varicosities    Patient Active Problem List   Diagnosis Date Noted   Complete heart block ([place]) 11/26/2018   Essential hypertension 11/01/2015   Pacemaker 10/11/2014   Bradycardia 09/20/2013   Diabetes mellitus without complication ([place])    Venous insufficiency    Atrial fibrillation ([place])    Unspecified sleep apnea    Obesity, unspecified    Hyperlipidemia     Past Surgical History:  Procedure Laterality Date   COLONOSCOPY WITH PROPOFOL N/A 07/03/2015   Procedure: COLONOSCOPY WITH PROPOFOL;  Surgeon: Garlan Fair, MD;  Location: [facility];  Service: Endoscopy;  Laterality: N/A;   HERNIA REPAIR     inguinal   PACEMAKER INSERTION  09-22-2013   BSX single chamber pacemaker implanted by Dr Lovena Le for symptomatic bradycardia   PERMANENT PACEMAKER INSERTION N/A 09/22/2013   Procedure: PERMANENT PACEMAKER INSERTION;  Surgeon: Evans Lance, MD;  Location: [facility] CATH LAB;  Service: Cardiovascular;  Laterality: N/A;   VARICOSE VEIN SURGERY Bilateral        Family History  Problem Relation Age of Onset   CAD Mother    Breast cancer Mother    Diabetes Mother    CAD Father    Prostate cancer Father    Heart failure Brother    CAD Brother     Social History   Tobacco Use   Smoking status: Former    Types: Cigarettes    Quit date: 06/25/1985    Years since quitting: 35.5   Smokeless tobacco: Never  Vaping Use   Vaping Use: Never used  Substance Use Topics   Alcohol use: No   Drug use: No    Home Medications Prior to Admission medications   Medication Sig Start Date End Date Taking? Authorizing Provider  atorvastatin (LIPITOR) 10 MG tablet Take 10 mg by mouth daily.   Yes [provider]  cephALEXin (KEFLEX) 500 MG capsule Take 1 capsule (500 mg total) by mouth 3 (three) times daily for 5 days. 12/22/20 12/27/20 Yes Luna Fuse, MD  Cholecalciferol (VITAMIN D3) 50 MCG (2000 UT) TABS Take by mouth.   Yes [provider]  fluticasone (FLONASE) 50 MCG/ACT nasal spray Place 1 spray into both nostrils 2 (two) times daily as needed for allergies.   Yes [provider]  glipiZIDE (GLUCOTROL) 10 MG tablet Take 5 mg by mouth 2 (two) times daily before a meal.    Yes [provider]  metFORMIN (GLUCOPHAGE) 500 MG tablet Take 1,000 mg by mouth 2 (two) times daily with a meal.    Yes [provider]  Multiple Vitamin (MULTIVITAMIN WITH MINERALS) TABS tablet Take 1 tablet by mouth daily with supper.    Yes [provider]  Omega-3 Fatty Acids (FISH OIL) 1000 MG CAPS Take 2,000 mg by mouth 2 (two) times daily.    Yes [provider]  verapamil (CALAN-SR) 240 MG CR tablet TAKE ONE TABLET BY MOUTH EVERYDAY AT BEDTIME 11/15/20  Yes Jettie Booze, MD  warfarin (COUMADIN) 10 MG tablet 5-10 mg daily at 6 PM. He takes one tablet everyday except on Wednesdays. He  takes half a tablet on that one day.   Yes [provider]  COVID-19 mRNA bivalent vaccine, Pfizer, (PFIZER COVID-19 VAC BIVALENT) injection Inject into the muscle. 11/22/20   Carlyle Basques, MD    Allergies    Lisinopril  Review of Systems   Review of Systems  Constitutional:  Negative for fever.  HENT:  Negative for ear pain and sore throat.   Eyes:  Negative for pain.  Respiratory:  Negative for cough.   Cardiovascular:  Negative for chest pain.  Gastrointestinal:  Negative for abdominal pain.  Genitourinary:  Negative for flank pain.  Musculoskeletal:  Negative for back pain.  Skin:  Positive for rash. Negative for color change.  Neurological:  Negative for syncope.  All other systems reviewed and are negative.  Physical Exam Updated Vital Signs BP (!) 170/66 (BP Location: Right Arm)   Pulse 63   Temp 97.7 F (36.5 C) (Oral)   Resp 18   Ht 6\' 1"  (1.854 m)   Wt 108.9 kg   SpO2 100%   BMI 31.66 kg/m   Physical Exam Constitutional:      Appearance: He is well-developed.  HENT:     Head: Normocephalic.     Nose: Nose normal.  Eyes:     Extraocular Movements: Extraocular movements intact.  Cardiovascular:     Rate and Rhythm: Normal rate.  Pulmonary:     Effort: Pulmonary effort is normal.  Skin:    Coloration: Skin is not jaundiced.     Comments: 3 x 3 cm abscess, positive tenderness positive fluctuance on the left lateral deltoid.  No surrounding cellulitis noted.  Neurological:     Mental Status: He is alert. Mental status is at baseline.    ED Results / Procedures / Treatments   Labs (all labs ordered are listed, but only abnormal results are displayed) Labs Reviewed  CBG MONITORING, ED - Abnormal; Notable for the following components:      Result Value   Glucose-Capillary 106 (*)    All other components within normal limits    EKG None  Radiology No results found.  Procedures .Marland KitchenIncision and Drainage  Date/Time: 12/22/2020 8:27  PM Performed by: Luna Fuse, MD Authorized by: Luna Fuse, MD   Comments:     1% epinephrine total of 2 cc instilled.  11 blade used to make a 1.5 cm linear laceration.  Approximately 2 cc of purulent material expressed.  Abscess was packed with quarter inch iodoform gauze.  Patient tolerated procedure well.  Dressing placed afterwards.  Advised return in 2 to 3 days for wound check.  Advised immediate return if he has fevers  cough vomiting or any additional concerns.   Medications Ordered in ED Medications  lidocaine (PF) (XYLOCAINE) 1 % injection 5 mL (5 mLs Infiltration Given 12/22/20 1921)    ED Course  I have reviewed the triage vital signs and the nursing notes.  Pertinent labs & imaging results that were available during my care of the patient were reviewed by me and considered in my medical decision making (see chart for details).    MDM Rules/Calculators/A&P                           Final Clinical Impression(s) / ED Diagnoses Final diagnoses:  Abscess of left shoulder    Rx / DC Orders ED Discharge Orders          Ordered    cephALEXin (KEFLEX) 500 MG capsule  3 times daily        12/22/20 2030             Luna Fuse, MD 12/22/20 2030

## 2020-12-22 NOTE — Discharge Instructions (Signed)
Keep the packing in place for 2 to 3 days.  Return to the ER in 2 to 3 days to remove the packing.  Return immediately if you get increased redness increased pain fevers or any additional concerns.  You may wash the affected area daily with soap and water, as needed.  Be careful not to pull out the packing early.  Afterwards, keep the wound covered with gauze or Band-Aid.

## 2020-12-28 ENCOUNTER — Other Ambulatory Visit: Payer: Self-pay

## 2020-12-28 ENCOUNTER — Encounter: Payer: Self-pay | Admitting: Internal Medicine

## 2020-12-28 ENCOUNTER — Ambulatory Visit: Payer: Medicare HMO | Admitting: Internal Medicine

## 2020-12-28 VITALS — BP 130/72 | HR 60 | Ht 73.0 in | Wt 244.6 lb

## 2020-12-28 DIAGNOSIS — Z950 Presence of cardiac pacemaker: Secondary | ICD-10-CM

## 2020-12-28 DIAGNOSIS — I482 Chronic atrial fibrillation, unspecified: Secondary | ICD-10-CM

## 2020-12-28 DIAGNOSIS — I10 Essential (primary) hypertension: Secondary | ICD-10-CM | POA: Diagnosis not present

## 2020-12-28 DIAGNOSIS — I442 Atrioventricular block, complete: Secondary | ICD-10-CM

## 2020-12-28 NOTE — Progress Notes (Signed)
HPI [name] returns today for follow-up. He is a very pleasant 80 year old man with a history of chronic atrial fibrillation and complete heart block, status post permanent pacemaker insertion. He has done well in the interim except he feels some increased fatigue and weakness and dsypnea with exertion, especially when walking. His balance is a little bit off as well. Unchanged from last year. He developed a boil on his left arm and it has been lanced and he has been on anti-biotics. He denies fever or chills. Allergies  Allergen Reactions   Lisinopril     "Very low blood pressure"     Current Outpatient Medications  Medication Sig Dispense Refill   atorvastatin (LIPITOR) 10 MG tablet Take 10 mg by mouth daily.     Cholecalciferol (VITAMIN D3) 50 MCG (2000 UT) TABS Take by mouth.     COVID-19 mRNA bivalent vaccine, Pfizer, (PFIZER COVID-19 VAC BIVALENT) injection Inject into the muscle. 0.3 mL 0   fluticasone (FLONASE) 50 MCG/ACT nasal spray Place 1 spray into both nostrils 2 (two) times daily as needed for allergies.     glipiZIDE (GLUCOTROL) 10 MG tablet Take 5 mg by mouth 2 (two) times daily before a meal.      metFORMIN (GLUCOPHAGE) 500 MG tablet Take 1,000 mg by mouth 2 (two) times daily with a meal.      Multiple Vitamin (MULTIVITAMIN WITH MINERALS) TABS tablet Take 1 tablet by mouth daily with supper.      Omega-3 Fatty Acids (FISH OIL) 1000 MG CAPS Take 2,000 mg by mouth 2 (two) times daily.      verapamil (CALAN-SR) 240 MG CR tablet TAKE ONE TABLET BY MOUTH EVERYDAY AT BEDTIME 90 tablet 3   warfarin (COUMADIN) 10 MG tablet 10 mg daily at 6 PM. He takes one tablet everyday except on Wednesdays. He takes half a tablet on that one day.     No current facility-administered medications for this visit.     Past Medical History:  Diagnosis Date   Adenomatous colon polyp    Arthritis    hands, knees   Atrial fibrillation (HCC)    failed cardioversion and passed, rate  control and anticoagulation    Diabetes mellitus without complication (HCC)    Dysrhythmia    Chronic Atrial Fibrillation- in and out   ED (erectile dysfunction)    does not desire treatment   Hyperlipidemia    Hypertension    Obesity, unspecified    Unspecified sleep apnea    Venous insufficiency    with lower extremity venous varicosities    ROS:   All systems reviewed and negative except as noted in the HPI.   Past Surgical History:  Procedure Laterality Date   COLONOSCOPY WITH PROPOFOL N/A 07/03/2015   Procedure: COLONOSCOPY WITH PROPOFOL;  Surgeon: Garlan Fair, MD;  Location: [facility];  Service: Endoscopy;  Laterality: N/A;   HERNIA REPAIR     inguinal   PACEMAKER INSERTION  09-22-2013   BSX single chamber pacemaker implanted by Dr Lovena Le for symptomatic bradycardia   PERMANENT PACEMAKER INSERTION N/A 09/22/2013   Procedure: PERMANENT PACEMAKER INSERTION;  Surgeon: Evans Lance, MD;  Location: [facility] CATH LAB;  Service: Cardiovascular;  Laterality: N/A;   VARICOSE VEIN SURGERY Bilateral      Family History  Problem Relation Age of Onset   CAD Mother    Breast cancer Mother    Diabetes Mother    CAD Father    Prostate  cancer Father    Heart failure Brother    CAD Brother      Social History   Socioeconomic History   Marital status: Married    Spouse name: Not on file   Number of children: Not on file   Years of education: Not on file   Highest education level: Not on file  Occupational History   Not on file  Tobacco Use   Smoking status: Former    Types: Cigarettes    Quit date: 06/25/1985    Years since quitting: 35.5   Smokeless tobacco: Never  Vaping Use   Vaping Use: Never used  Substance and Sexual Activity   Alcohol use: No   Drug use: No   Sexual activity: Not on file  Other Topics Concern   Not on file  Social History Narrative   Not on file   Social Determinants of Health   Financial Resource Strain: Not on file  Food Insecurity:  Not on file  Transportation Needs: Not on file  Physical Activity: Not on file  Stress: Not on file  Social Connections: Not on file  Intimate Partner Violence: Not on file     BP 130/72   Pulse 60   Ht 6\' 1"  (1.854 m)   Wt 244 lb 9.6 oz (110.9 kg)   SpO2 97%   BMI 32.27 kg/m   Physical Exam:  Well appearing NAD HEENT: Unremarkable Neck:  No JVD, no thyromegally Lymphatics:  No adenopathy Back:  No CVA tenderness Lungs:  Clear with no wheezes HEART:  Regular rate rhythm, no murmurs, no rubs, no clicks Abd:  soft, positive bowel sounds, no organomegally, no rebound, no guarding Ext:  2 plus pulses, no edema, no cyanosis, no clubbing Skin:  No rashes no nodules Neuro:  CN II through XII intact, motor grossly intact  EKG - atrial fib with ventricular pacing  DEVICE  Normal device function.  See PaceArt for details.   Assess/Plan:  Atrial fib - his VR is well controlled and he is paced over 90% of the time.  Coags - he has not had any bleeding on coumadin. We will follow. HTN - his bp is controlled. He will continue verapamil. PPM - his [place] [employer] single chamber PPM is working normally and we will recheck in several months.  Carleene Overlie Raihan Kimmel,MD

## 2020-12-28 NOTE — Patient Instructions (Addendum)
Medication Instructions:  Your physician recommends that you continue on your current medications as directed. Please refer to the Current Medication list given to you today.  Labwork: None ordered.  Testing/Procedures: None ordered.  Follow-Up: Your physician wants you to follow-up in: one year with Patrick Peru, MD   Remote monitoring is used to monitor your Pacemaker from home. This monitoring reduces the number of office visits required to check your device to one time per year. It allows [place] to keep an eye on the functioning of your device to ensure it is working properly. You are scheduled for a device check from home on 01/31/21. You may send your transmission at any time that day. If you have a wireless device, the transmission will be sent automatically. After your physician reviews your transmission, you will receive a postcard with your next transmission date.  Any Other Special Instructions Will Be Listed Below (If Applicable).  If you need a refill on your cardiac medications before your next appointment, please call your pharmacy.

## 2021-01-02 DIAGNOSIS — Z7901 Long term (current) use of anticoagulants: Secondary | ICD-10-CM | POA: Diagnosis not present

## 2021-01-02 LAB — CUP PACEART INCLINIC DEVICE CHECK
Brady Statistic RV Percent Paced: 100 %
Date Time Interrogation Session: 20221110000000
Implantable Lead Implant Date: 20150805
Implantable Lead Location: 753860
Implantable Lead Model: 4137
Implantable Lead Serial Number: 29478499
Implantable Pulse Generator Implant Date: 20150805
Lead Channel Pacing Threshold Amplitude: 0.8 V
Lead Channel Pacing Threshold Pulse Width: 0.4 ms
Lead Channel Setting Pacing Amplitude: 1.6 V
Lead Channel Setting Pacing Pulse Width: 0.4 ms
Lead Channel Setting Sensing Sensitivity: 2.5 mV
Pulse Gen Serial Number: 119862

## 2021-01-31 ENCOUNTER — Ambulatory Visit (INDEPENDENT_AMBULATORY_CARE_PROVIDER_SITE_OTHER): Payer: Medicare HMO

## 2021-01-31 DIAGNOSIS — I442 Atrioventricular block, complete: Secondary | ICD-10-CM | POA: Diagnosis not present

## 2021-01-31 LAB — CUP PACEART REMOTE DEVICE CHECK
Battery Remaining Longevity: 30 mo
Battery Remaining Percentage: 40 %
Brady Statistic RV Percent Paced: 100 %
Date Time Interrogation Session: 20221214030100
Implantable Lead Implant Date: 20150805
Implantable Lead Location: 753860
Implantable Lead Model: 4137
Implantable Lead Serial Number: 29478499
Implantable Pulse Generator Implant Date: 20150805
Lead Channel Impedance Value: 743 Ohm
Lead Channel Pacing Threshold Amplitude: 0.9 V
Lead Channel Pacing Threshold Pulse Width: 0.4 ms
Lead Channel Setting Pacing Amplitude: 1.4 V
Lead Channel Setting Pacing Pulse Width: 0.4 ms
Lead Channel Setting Sensing Sensitivity: 2.5 mV
Pulse Gen Serial Number: 119862

## 2021-02-07 DIAGNOSIS — E1169 Type 2 diabetes mellitus with other specified complication: Secondary | ICD-10-CM | POA: Diagnosis not present

## 2021-02-07 DIAGNOSIS — E782 Mixed hyperlipidemia: Secondary | ICD-10-CM | POA: Diagnosis not present

## 2021-02-07 DIAGNOSIS — G4700 Insomnia, unspecified: Secondary | ICD-10-CM | POA: Diagnosis not present

## 2021-02-07 DIAGNOSIS — M154 Erosive (osteo)arthritis: Secondary | ICD-10-CM | POA: Diagnosis not present

## 2021-02-08 DIAGNOSIS — L84 Corns and callosities: Secondary | ICD-10-CM | POA: Diagnosis not present

## 2021-02-08 DIAGNOSIS — L602 Onychogryphosis: Secondary | ICD-10-CM | POA: Diagnosis not present

## 2021-02-08 DIAGNOSIS — E1151 Type 2 diabetes mellitus with diabetic peripheral angiopathy without gangrene: Secondary | ICD-10-CM | POA: Diagnosis not present

## 2021-02-09 NOTE — Progress Notes (Signed)
Remote pacemaker transmission.   

## 2021-03-02 DIAGNOSIS — G4733 Obstructive sleep apnea (adult) (pediatric): Secondary | ICD-10-CM | POA: Diagnosis not present

## 2021-03-08 DIAGNOSIS — Z7901 Long term (current) use of anticoagulants: Secondary | ICD-10-CM | POA: Diagnosis not present

## 2021-03-11 DIAGNOSIS — G4733 Obstructive sleep apnea (adult) (pediatric): Secondary | ICD-10-CM | POA: Diagnosis not present

## 2021-03-12 DIAGNOSIS — H401131 Primary open-angle glaucoma, bilateral, mild stage: Secondary | ICD-10-CM | POA: Diagnosis not present

## 2021-03-22 DIAGNOSIS — Z7984 Long term (current) use of oral hypoglycemic drugs: Secondary | ICD-10-CM | POA: Diagnosis not present

## 2021-03-22 DIAGNOSIS — E1169 Type 2 diabetes mellitus with other specified complication: Secondary | ICD-10-CM | POA: Diagnosis not present

## 2021-03-22 DIAGNOSIS — R69 Illness, unspecified: Secondary | ICD-10-CM | POA: Diagnosis not present

## 2021-04-20 DIAGNOSIS — Z7901 Long term (current) use of anticoagulants: Secondary | ICD-10-CM | POA: Diagnosis not present

## 2021-04-30 DIAGNOSIS — Z7901 Long term (current) use of anticoagulants: Secondary | ICD-10-CM | POA: Diagnosis not present

## 2021-05-02 ENCOUNTER — Ambulatory Visit (INDEPENDENT_AMBULATORY_CARE_PROVIDER_SITE_OTHER): Payer: Medicare HMO

## 2021-05-02 DIAGNOSIS — B351 Tinea unguium: Secondary | ICD-10-CM | POA: Diagnosis not present

## 2021-05-02 DIAGNOSIS — I442 Atrioventricular block, complete: Secondary | ICD-10-CM | POA: Diagnosis not present

## 2021-05-02 DIAGNOSIS — E1151 Type 2 diabetes mellitus with diabetic peripheral angiopathy without gangrene: Secondary | ICD-10-CM | POA: Diagnosis not present

## 2021-05-02 DIAGNOSIS — I482 Chronic atrial fibrillation, unspecified: Secondary | ICD-10-CM

## 2021-05-02 DIAGNOSIS — L602 Onychogryphosis: Secondary | ICD-10-CM | POA: Diagnosis not present

## 2021-05-02 LAB — CUP PACEART REMOTE DEVICE CHECK
Battery Remaining Longevity: 30 mo
Battery Remaining Percentage: 36 %
Brady Statistic RV Percent Paced: 100 %
Date Time Interrogation Session: 20230315031500
Implantable Lead Implant Date: 20150805
Implantable Lead Location: 753860
Implantable Lead Model: 4137
Implantable Lead Serial Number: 29478499
Implantable Pulse Generator Implant Date: 20150805
Lead Channel Impedance Value: 721 Ohm
Lead Channel Pacing Threshold Amplitude: 1 V
Lead Channel Pacing Threshold Pulse Width: 0.4 ms
Lead Channel Setting Pacing Amplitude: 1.5 V
Lead Channel Setting Pacing Pulse Width: 0.4 ms
Lead Channel Setting Sensing Sensitivity: 2.5 mV
Pulse Gen Serial Number: 119862

## 2021-05-07 DIAGNOSIS — E782 Mixed hyperlipidemia: Secondary | ICD-10-CM | POA: Diagnosis not present

## 2021-05-07 DIAGNOSIS — E1169 Type 2 diabetes mellitus with other specified complication: Secondary | ICD-10-CM | POA: Diagnosis not present

## 2021-05-14 NOTE — Progress Notes (Signed)
Remote pacemaker transmission.   

## 2021-05-28 DIAGNOSIS — Z7901 Long term (current) use of anticoagulants: Secondary | ICD-10-CM | POA: Diagnosis not present

## 2021-06-06 DIAGNOSIS — G4733 Obstructive sleep apnea (adult) (pediatric): Secondary | ICD-10-CM | POA: Diagnosis not present

## 2021-06-09 DIAGNOSIS — G4733 Obstructive sleep apnea (adult) (pediatric): Secondary | ICD-10-CM | POA: Diagnosis not present

## 2021-06-11 DIAGNOSIS — H401131 Primary open-angle glaucoma, bilateral, mild stage: Secondary | ICD-10-CM | POA: Diagnosis not present

## 2021-07-10 NOTE — Progress Notes (Signed)
Office Visit    Patient Name: Patrick Lozano Date of Encounter: 07/11/2021  Primary Care Provider:  Lavone Orn, MD Primary Cardiologist:  Larae Grooms, MD Primary Electrophysiologist: Cristopher Peru, MD  Chief Complaint    Patrick Lozano is a 81 y.o. male with PMH of AF on coumadin, CHB s/p PPM implant (09/2013), HTN,HLD, venous insufficiency, and DM who presents today for 9 month follow up for atrial fibrillation.  Past Medical History    Past Medical History:  Diagnosis Date   Adenomatous colon polyp    Arthritis    hands, knees   Atrial fibrillation (HCC)    failed cardioversion and passed, rate control and anticoagulation    Diabetes mellitus without complication (HCC)    Dysrhythmia    Chronic Atrial Fibrillation- in and out   ED (erectile dysfunction)    does not desire treatment   Hyperlipidemia    Hypertension    Obesity, unspecified    Unspecified sleep apnea    Venous insufficiency    with lower extremity venous varicosities   Past Surgical History:  Procedure Laterality Date   COLONOSCOPY WITH PROPOFOL N/A 07/03/2015   Procedure: COLONOSCOPY WITH PROPOFOL;  Surgeon: Garlan Fair, MD;  Location: [facility];  Service: Endoscopy;  Laterality: N/A;   HERNIA REPAIR     inguinal   PACEMAKER INSERTION  09-22-2013   BSX single chamber pacemaker implanted by Dr Lovena Le for symptomatic bradycardia   PERMANENT PACEMAKER INSERTION N/A 09/22/2013   Procedure: PERMANENT PACEMAKER INSERTION;  Surgeon: Evans Lance, MD;  Location: [facility] CATH LAB;  Service: Cardiovascular;  Laterality: N/A;   VARICOSE VEIN SURGERY Bilateral     Allergies  Allergies  Allergen Reactions   Lisinopril     "Very low blood pressure"    History of Present Illness    Patrick Lozano has a PMH of permanent AF on coumadin, venous insufficiency, CHB s/p PPM implant (09/2013),HTN,HLD, and DM. He was last seen by dr. Irish Lack on 09/2020. During visit no medications were changed and  patient reported no bleeding issues on coumadin. He is followed by Dr. Lovena Le and was last seen on 12/28/20 for his single chamber PPM. His blood pressure was well controlled currently on verapamil.  Since last being seen in the office patient reports that he is doing well with no new complaints since his last appointment with Dr.Varanasi in August 2022.  His blood pressure today was 130/80 and he is compliant with all medications.  EKG shows he is V paced at a rate of 60 and his device is followed by Dr. Lovena Le.  Patient denies dyspnea, PND, orthopnea, nausea, vomiting, dizziness, syncope,weight gain, or early satiety.  He does have bilateral lower extremity edema that is due to venous insufficiency.  He is still active and cuts his grass with a push mower in a yard that has many hills.  He does experience shortness of breath but denies any anginal or dizziness with this activity.  He is a widower and lost his wife last year, I offered my condolences. He states that he plans to sell his house and moved down [place] after his next device change out with Dr. Lovena Le.  He is currently compliant with his Coumadin and states that Dr. Laurann Montana with [facility] follows his INR and dosing.  I discussed with him briefly the options of NOAC versus Coumadin and he stated that he has been on Coumadin for over 25 years and politely declines any changes at  this time.  Home Medications    Current Outpatient Medications  Medication Sig Dispense Refill   atorvastatin (LIPITOR) 10 MG tablet Take 10 mg by mouth daily.     Cholecalciferol (VITAMIN D3) 50 MCG (2000 UT) TABS Take by mouth.     COVID-19 mRNA bivalent vaccine, Pfizer, (PFIZER COVID-19 VAC BIVALENT) injection Inject into the muscle. 0.3 mL 0   fluticasone (FLONASE) 50 MCG/ACT nasal spray Place 1 spray into both nostrils 2 (two) times daily as needed for allergies.     glipiZIDE (GLUCOTROL) 10 MG tablet Take 5 mg by mouth 2 (two) times daily before a meal.      metFORMIN  (GLUCOPHAGE) 500 MG tablet Take 1,000 mg by mouth 2 (two) times daily with a meal.      Multiple Vitamin (MULTIVITAMIN WITH MINERALS) TABS tablet Take 1 tablet by mouth daily with supper.      Omega-3 Fatty Acids (FISH OIL) 1000 MG CAPS Take 2,000 mg by mouth 2 (two) times daily.      verapamil (CALAN-SR) 240 MG CR tablet TAKE ONE TABLET BY MOUTH EVERYDAY AT BEDTIME 90 tablet 3   warfarin (COUMADIN) 10 MG tablet 10 mg daily at 6 PM. He takes one tablet everyday except on Wednesdays. He takes half a tablet on that one day.     No current facility-administered medications for this visit.     Review of Systems  Please see the history of present illness.    (+) Trace edema in lower extremities All other systems reviewed and are otherwise negative except as noted above.  Physical Exam    Wt Readings from Last 3 Encounters:  07/11/21 241 lb (109.3 kg)  12/28/20 244 lb 9.6 oz (110.9 kg)  12/22/20 240 lb (108.9 kg)   VS: Vitals:   07/11/21 1509  BP: 130/80  Pulse: 60  ,Body mass index is 31.8 kg/m.  Constitutional:      Appearance: Healthy appearance. Not in distress.  Neck:     Vascular: JVD normal.  Pulmonary:     Effort: Pulmonary effort is normal.     Breath sounds: No wheezing. No rales. Diminished in the bases Cardiovascular:     Normal rate. Regular rhythm. Normal S1. Normal S2.      Murmurs: There is no murmur.  Edema:    Peripheral edema absent.  Abdominal:     Palpations: Abdomen is soft non tender. There is no hepatomegaly.  Skin:    General: Skin is warm and dry.  Neurological:     General: No focal deficit present.     Mental Status: Alert and oriented to person, place and time.     Cranial Nerves: Cranial nerves are intact.  EKG/LABS/Other Studies Reviewed    ECG personally reviewed by me today -ventricular paced rhythm with with a rate of 60 and no acute changes  Risk Assessment/Calculations:    CHA2DS2-VASc Score = 4   This indicates a 4.8% annual risk  of stroke. The patient's score is based upon: CHF History: 0 HTN History: 1 Diabetes History: 1 Stroke History: 0 Vascular Disease History: 0 Age Score: 2 Gender Score: 0           Lab Results  Component Value Date   WBC 6.2 09/21/2013   HGB 13.1 09/21/2013   HCT 39.3 09/21/2013   MCV 89.3 09/21/2013   PLT 193 09/21/2013   Lab Results  Component Value Date   CREATININE 1.03 09/21/2013   BUN 19 09/21/2013  NA 139 09/21/2013   K 4.4 09/21/2013   CL 102 09/21/2013   CO2 26 09/21/2013   Lab Results  Component Value Date   ALT 31 11/18/2013   AST 25 11/18/2013   ALKPHOS 48 11/18/2013   BILITOT 0.9 11/18/2013   Lab Results  Component Value Date   CHOL 119 11/18/2013   HDL 32.70 (L) 11/18/2013   LDLCALC 67 11/18/2013   TRIG 98.0 11/18/2013   CHOLHDL 4 11/18/2013    Lab Results  Component Value Date   HGBA1C 6.8 (H) 09/20/2013    Assessment & Plan    1.  Essential hypertension: -Patient's blood pressure today was well controlled at 130/80 at goal -Continue current antihypertensive regimen with verapamil 240 mg daily -Continue low-sodium heart healthy diet  2.  Permanent atrial fibrillation: -Patient is ventricular paced at a rate of 60 -Patient denies any bleeding or excessive bruising -Continue Coumadin as directed by primary care office  -Patient will have CBC and BMET completed next month with physical exam at PCP (he declined to have at this visit)  3.  Complete heart block: -s/p single-chamber PPM implanted on 09/2013 and followed by Dr. Lovena Le -Patient near ERI with plan to have device changed out next year -Last remote transmission stable with appropriate histograms  4.  Hyperlipidemia: -Patient's last LDL cholesterol was 49 on 06/2020.  -Labs will be requested from his PCP  -Continue Lipitor 10 mg daily  5. Mild aortic stenosis, mild mitral regurgitation by echo 2019 -per guidelines, consider repeat echo next year at 5 year mark, but will  hold off at this time as completely asymptomatic     Disposition: Follow-up with Larae Grooms, MD 12 months     Medication Adjustments/Labs and Tests Ordered: Current medicines are reviewed at length with the patient today.  Concerns regarding medicines are outlined above.   Signed, Mable Fill, Marissa Nestle, NP 07/11/2021, 4:09 PM [place]

## 2021-07-11 ENCOUNTER — Ambulatory Visit: Payer: Medicare HMO | Admitting: Nurse Practitioner

## 2021-07-11 ENCOUNTER — Telehealth: Payer: Self-pay | Admitting: *Deleted

## 2021-07-11 ENCOUNTER — Encounter: Payer: Self-pay | Admitting: Nurse Practitioner

## 2021-07-11 VITALS — BP 130/80 | HR 60 | Ht 73.0 in | Wt 241.0 lb

## 2021-07-11 DIAGNOSIS — M722 Plantar fascial fibromatosis: Secondary | ICD-10-CM | POA: Diagnosis not present

## 2021-07-11 DIAGNOSIS — L602 Onychogryphosis: Secondary | ICD-10-CM | POA: Diagnosis not present

## 2021-07-11 DIAGNOSIS — E1151 Type 2 diabetes mellitus with diabetic peripheral angiopathy without gangrene: Secondary | ICD-10-CM | POA: Diagnosis not present

## 2021-07-11 DIAGNOSIS — I442 Atrioventricular block, complete: Secondary | ICD-10-CM

## 2021-07-11 DIAGNOSIS — E782 Mixed hyperlipidemia: Secondary | ICD-10-CM | POA: Diagnosis not present

## 2021-07-11 DIAGNOSIS — I10 Essential (primary) hypertension: Secondary | ICD-10-CM

## 2021-07-11 DIAGNOSIS — I482 Chronic atrial fibrillation, unspecified: Secondary | ICD-10-CM

## 2021-07-11 DIAGNOSIS — L84 Corns and callosities: Secondary | ICD-10-CM | POA: Diagnosis not present

## 2021-07-11 NOTE — Patient Instructions (Signed)
Medication Instructions:   Your physician recommends that you continue on your current medications as directed. Please refer to the Current Medication list given to you today.   *If you need a refill on your cardiac medications before your next appointment, please call your pharmacy*   Lab Work:  None ordered.   If you have labs (blood work) drawn today and your tests are completely normal, you will receive your results only by: [place] (if you have MyChart) OR A paper copy in the mail If you have any lab test that is abnormal or we need to change your treatment, we will call you to review the results.   Testing/Procedures:   None ordered.    Follow-Up: At [facility], you and your health needs are our priority.  As part of our continuing mission to provide you with exceptional heart care, we have created designated Provider Care Teams.  These Care Teams include your primary Cardiologist (physician) and Advanced Practice Providers (APPs -  Physician Assistants and Nurse Practitioners) who all work together to provide you with the care you need, when you need it.  We recommend signing up for the patient portal called "MyChart".  Sign up information is provided on this After Visit Summary.  MyChart is used to connect with patients for Virtual Visits (Telemedicine).  Patients are able to view lab/test results, encounter notes, upcoming appointments, etc.  Non-urgent messages can be sent to your provider as well.   To learn more about what you can do with MyChart, go to [email or web address].    Your next appointment:   6 month(s)  The format for your next appointment:   In Person  Provider:   Dr. Crissie Sickles  If primary card or EP is not listed click here to update    :1}    Important Information About Sugar

## 2021-07-11 NOTE — Telephone Encounter (Signed)
Lvm for PCP/Dr.Griffin @ [phone] to get recent labs faxed to office for ERNEST.

## 2021-08-01 ENCOUNTER — Ambulatory Visit (INDEPENDENT_AMBULATORY_CARE_PROVIDER_SITE_OTHER): Payer: Medicare HMO

## 2021-08-01 DIAGNOSIS — Z7901 Long term (current) use of anticoagulants: Secondary | ICD-10-CM | POA: Diagnosis not present

## 2021-08-01 DIAGNOSIS — Z1331 Encounter for screening for depression: Secondary | ICD-10-CM | POA: Diagnosis not present

## 2021-08-01 DIAGNOSIS — Z950 Presence of cardiac pacemaker: Secondary | ICD-10-CM | POA: Diagnosis not present

## 2021-08-01 DIAGNOSIS — E782 Mixed hyperlipidemia: Secondary | ICD-10-CM | POA: Diagnosis not present

## 2021-08-01 DIAGNOSIS — G4733 Obstructive sleep apnea (adult) (pediatric): Secondary | ICD-10-CM | POA: Diagnosis not present

## 2021-08-01 DIAGNOSIS — Z0000 Encounter for general adult medical examination without abnormal findings: Secondary | ICD-10-CM | POA: Diagnosis not present

## 2021-08-01 DIAGNOSIS — D6869 Other thrombophilia: Secondary | ICD-10-CM | POA: Diagnosis not present

## 2021-08-01 DIAGNOSIS — I442 Atrioventricular block, complete: Secondary | ICD-10-CM | POA: Diagnosis not present

## 2021-08-01 DIAGNOSIS — I872 Venous insufficiency (chronic) (peripheral): Secondary | ICD-10-CM | POA: Diagnosis not present

## 2021-08-01 DIAGNOSIS — E1169 Type 2 diabetes mellitus with other specified complication: Secondary | ICD-10-CM | POA: Diagnosis not present

## 2021-08-01 DIAGNOSIS — I482 Chronic atrial fibrillation, unspecified: Secondary | ICD-10-CM | POA: Diagnosis not present

## 2021-08-02 DIAGNOSIS — E1151 Type 2 diabetes mellitus with diabetic peripheral angiopathy without gangrene: Secondary | ICD-10-CM | POA: Diagnosis not present

## 2021-08-02 DIAGNOSIS — E782 Mixed hyperlipidemia: Secondary | ICD-10-CM | POA: Diagnosis not present

## 2021-08-02 DIAGNOSIS — M722 Plantar fascial fibromatosis: Secondary | ICD-10-CM | POA: Diagnosis not present

## 2021-08-02 DIAGNOSIS — E1169 Type 2 diabetes mellitus with other specified complication: Secondary | ICD-10-CM | POA: Diagnosis not present

## 2021-08-03 LAB — CUP PACEART REMOTE DEVICE CHECK
Battery Remaining Longevity: 24 mo
Battery Remaining Percentage: 31 %
Brady Statistic RV Percent Paced: 100 %
Date Time Interrogation Session: 20230614030100
Implantable Lead Implant Date: 20150805
Implantable Lead Location: 753860
Implantable Lead Model: 4137
Implantable Lead Serial Number: 29478499
Implantable Pulse Generator Implant Date: 20150805
Lead Channel Impedance Value: 749 Ohm
Lead Channel Pacing Threshold Amplitude: 1 V
Lead Channel Pacing Threshold Pulse Width: 0.4 ms
Lead Channel Setting Pacing Amplitude: 1.5 V
Lead Channel Setting Pacing Pulse Width: 0.4 ms
Lead Channel Setting Sensing Sensitivity: 2.5 mV
Pulse Gen Serial Number: 119862

## 2021-09-10 DIAGNOSIS — H524 Presbyopia: Secondary | ICD-10-CM | POA: Diagnosis not present

## 2021-09-10 DIAGNOSIS — E119 Type 2 diabetes mellitus without complications: Secondary | ICD-10-CM | POA: Diagnosis not present

## 2021-09-10 DIAGNOSIS — H52203 Unspecified astigmatism, bilateral: Secondary | ICD-10-CM | POA: Diagnosis not present

## 2021-09-10 DIAGNOSIS — H401131 Primary open-angle glaucoma, bilateral, mild stage: Secondary | ICD-10-CM | POA: Diagnosis not present

## 2021-09-10 DIAGNOSIS — H5203 Hypermetropia, bilateral: Secondary | ICD-10-CM | POA: Diagnosis not present

## 2021-09-10 DIAGNOSIS — Z961 Presence of intraocular lens: Secondary | ICD-10-CM | POA: Diagnosis not present

## 2021-09-10 DIAGNOSIS — Z7984 Long term (current) use of oral hypoglycemic drugs: Secondary | ICD-10-CM | POA: Diagnosis not present

## 2021-09-11 DIAGNOSIS — Z7901 Long term (current) use of anticoagulants: Secondary | ICD-10-CM | POA: Diagnosis not present

## 2021-09-19 DIAGNOSIS — E1151 Type 2 diabetes mellitus with diabetic peripheral angiopathy without gangrene: Secondary | ICD-10-CM | POA: Diagnosis not present

## 2021-09-19 DIAGNOSIS — L84 Corns and callosities: Secondary | ICD-10-CM | POA: Diagnosis not present

## 2021-09-19 DIAGNOSIS — L603 Nail dystrophy: Secondary | ICD-10-CM | POA: Diagnosis not present

## 2021-09-19 DIAGNOSIS — B351 Tinea unguium: Secondary | ICD-10-CM | POA: Diagnosis not present

## 2021-09-19 DIAGNOSIS — M722 Plantar fascial fibromatosis: Secondary | ICD-10-CM | POA: Diagnosis not present

## 2021-10-04 DIAGNOSIS — E782 Mixed hyperlipidemia: Secondary | ICD-10-CM | POA: Diagnosis not present

## 2021-10-04 DIAGNOSIS — G4700 Insomnia, unspecified: Secondary | ICD-10-CM | POA: Diagnosis not present

## 2021-10-04 DIAGNOSIS — I482 Chronic atrial fibrillation, unspecified: Secondary | ICD-10-CM | POA: Diagnosis not present

## 2021-10-04 DIAGNOSIS — M154 Erosive (osteo)arthritis: Secondary | ICD-10-CM | POA: Diagnosis not present

## 2021-10-04 DIAGNOSIS — E1169 Type 2 diabetes mellitus with other specified complication: Secondary | ICD-10-CM | POA: Diagnosis not present

## 2021-10-10 DIAGNOSIS — Z7901 Long term (current) use of anticoagulants: Secondary | ICD-10-CM | POA: Diagnosis not present

## 2021-10-29 ENCOUNTER — Other Ambulatory Visit: Payer: Self-pay | Admitting: Interventional Cardiology

## 2021-10-31 ENCOUNTER — Ambulatory Visit (INDEPENDENT_AMBULATORY_CARE_PROVIDER_SITE_OTHER): Payer: Medicare HMO

## 2021-10-31 DIAGNOSIS — I442 Atrioventricular block, complete: Secondary | ICD-10-CM | POA: Diagnosis not present

## 2021-11-01 DIAGNOSIS — E782 Mixed hyperlipidemia: Secondary | ICD-10-CM | POA: Diagnosis not present

## 2021-11-01 DIAGNOSIS — G4700 Insomnia, unspecified: Secondary | ICD-10-CM | POA: Diagnosis not present

## 2021-11-01 DIAGNOSIS — E1169 Type 2 diabetes mellitus with other specified complication: Secondary | ICD-10-CM | POA: Diagnosis not present

## 2021-11-01 DIAGNOSIS — M154 Erosive (osteo)arthritis: Secondary | ICD-10-CM | POA: Diagnosis not present

## 2021-11-01 DIAGNOSIS — I482 Chronic atrial fibrillation, unspecified: Secondary | ICD-10-CM | POA: Diagnosis not present

## 2021-11-01 LAB — CUP PACEART REMOTE DEVICE CHECK
Battery Remaining Longevity: 24 mo
Battery Remaining Percentage: 28 %
Brady Statistic RV Percent Paced: 100 %
Date Time Interrogation Session: 20230913030100
Implantable Lead Implant Date: 20150805
Implantable Lead Location: 753860
Implantable Lead Model: 4137
Implantable Lead Serial Number: 29478499
Implantable Pulse Generator Implant Date: 20150805
Lead Channel Impedance Value: 762 Ohm
Lead Channel Pacing Threshold Amplitude: 1.1 V
Lead Channel Pacing Threshold Pulse Width: 0.4 ms
Lead Channel Setting Pacing Amplitude: 1.6 V
Lead Channel Setting Pacing Pulse Width: 0.4 ms
Lead Channel Setting Sensing Sensitivity: 2.5 mV
Pulse Gen Serial Number: 119862

## 2021-11-15 NOTE — Progress Notes (Signed)
Remote pacemaker transmission.   

## 2021-11-23 DIAGNOSIS — Z7901 Long term (current) use of anticoagulants: Secondary | ICD-10-CM | POA: Diagnosis not present

## 2021-11-26 DIAGNOSIS — L814 Other melanin hyperpigmentation: Secondary | ICD-10-CM | POA: Diagnosis not present

## 2021-11-26 DIAGNOSIS — Z85828 Personal history of other malignant neoplasm of skin: Secondary | ICD-10-CM | POA: Diagnosis not present

## 2021-11-26 DIAGNOSIS — L700 Acne vulgaris: Secondary | ICD-10-CM | POA: Diagnosis not present

## 2021-11-26 DIAGNOSIS — L578 Other skin changes due to chronic exposure to nonionizing radiation: Secondary | ICD-10-CM | POA: Diagnosis not present

## 2021-11-26 DIAGNOSIS — L821 Other seborrheic keratosis: Secondary | ICD-10-CM | POA: Diagnosis not present

## 2021-11-28 ENCOUNTER — Other Ambulatory Visit (HOSPITAL_BASED_OUTPATIENT_CLINIC_OR_DEPARTMENT_OTHER): Payer: Self-pay

## 2021-11-28 DIAGNOSIS — E1151 Type 2 diabetes mellitus with diabetic peripheral angiopathy without gangrene: Secondary | ICD-10-CM | POA: Diagnosis not present

## 2021-11-28 DIAGNOSIS — I879 Disorder of vein, unspecified: Secondary | ICD-10-CM | POA: Diagnosis not present

## 2021-11-28 DIAGNOSIS — I739 Peripheral vascular disease, unspecified: Secondary | ICD-10-CM | POA: Diagnosis not present

## 2021-11-28 MED ORDER — INFLUENZA VAC A&B SA ADJ QUAD 0.5 ML IM PRSY
PREFILLED_SYRINGE | INTRAMUSCULAR | 0 refills | Status: AC
  Filled 2021-11-28: qty 0.5, 1d supply, fill #0

## 2021-11-28 MED ORDER — COVID-19 MRNA 2023-2024 VACCINE (COMIRNATY) 0.3 ML INJECTION
INTRAMUSCULAR | 0 refills | Status: AC
  Filled 2021-11-28: qty 0.3, 1d supply, fill #0

## 2021-12-03 DIAGNOSIS — G4733 Obstructive sleep apnea (adult) (pediatric): Secondary | ICD-10-CM | POA: Diagnosis not present

## 2021-12-11 DIAGNOSIS — H401131 Primary open-angle glaucoma, bilateral, mild stage: Secondary | ICD-10-CM | POA: Diagnosis not present

## 2021-12-24 DIAGNOSIS — Z7901 Long term (current) use of anticoagulants: Secondary | ICD-10-CM | POA: Diagnosis not present

## 2021-12-25 ENCOUNTER — Encounter: Payer: Self-pay | Admitting: Internal Medicine

## 2021-12-25 ENCOUNTER — Ambulatory Visit: Payer: Medicare HMO | Attending: Internal Medicine | Admitting: Internal Medicine

## 2021-12-25 VITALS — BP 136/58 | HR 62 | Resp 18 | Wt 235.0 lb

## 2021-12-25 DIAGNOSIS — I4891 Unspecified atrial fibrillation: Secondary | ICD-10-CM

## 2021-12-25 DIAGNOSIS — Z950 Presence of cardiac pacemaker: Secondary | ICD-10-CM | POA: Diagnosis not present

## 2021-12-25 DIAGNOSIS — I442 Atrioventricular block, complete: Secondary | ICD-10-CM

## 2021-12-25 LAB — CUP PACEART INCLINIC DEVICE CHECK
Date Time Interrogation Session: 20231107142515
Implantable Lead Connection Status: 753985
Implantable Lead Implant Date: 20150805
Implantable Lead Location: 753860
Implantable Lead Model: 4137
Implantable Lead Serial Number: 29478499
Implantable Pulse Generator Implant Date: 20150805
Lead Channel Impedance Value: 738 Ohm
Lead Channel Pacing Threshold Amplitude: 0.9 V
Lead Channel Pacing Threshold Pulse Width: 0.4 ms
Lead Channel Setting Pacing Amplitude: 1.5 V
Lead Channel Setting Pacing Pulse Width: 0.4 ms
Lead Channel Setting Sensing Sensitivity: 2.5 mV
Pulse Gen Serial Number: 119862
Zone Setting Status: 755011

## 2021-12-25 NOTE — Patient Instructions (Signed)
Medication Instructions:  Your physician recommends that you continue on your current medications as directed. Please refer to the Current Medication list given to you today.  *If you need a refill on your cardiac medications before your next appointment, please call your pharmacy*  Lab Work: None ordered.  If you have labs (blood work) drawn today and your tests are completely normal, you will receive your results only by: [place] (if you have MyChart) OR A paper copy in the mail If you have any lab test that is abnormal or we need to change your treatment, we will call you to review the results.  Testing/Procedures: None ordered.  Follow-Up: At [facility], you and your health needs are our priority.  As part of our continuing mission to provide you with exceptional heart care, we have created designated Provider Care Teams.  These Care Teams include your primary Cardiologist (physician) and Advanced Practice Providers (APPs -  Physician Assistants and Nurse Practitioners) who all work together to provide you with the care you need, when you need it.  We recommend signing up for the patient portal called "MyChart".  Sign up information is provided on this After Visit Summary.  MyChart is used to connect with patients for Virtual Visits (Telemedicine).  Patients are able to view lab/test results, encounter notes, upcoming appointments, etc.  Non-urgent messages can be sent to your provider as well.   To learn more about what you can do with MyChart, go to [email or web address].    Your next appointment:   1 year(s)  The format for your next appointment:   In Person  Provider:   Cristopher Peru, MD{or one of the following Advanced Practice Providers on your designated Care Team:   Tommye Standard, [place] Legrand Como "Jonni Sanger" Chalmers Cater, [place]  Remote monitoring is used to monitor your Pacemaker from home. This monitoring reduces the number of office visits required to check your device to  one time per year. It allows [place] to keep an eye on the functioning of your device to ensure it is working properly. You are scheduled for a device check from home on 01/30/22. You may send your transmission at any time that day. If you have a wireless device, the transmission will be sent automatically. After your physician reviews your transmission, you will receive a postcard with your next transmission date.  Important Information About Sugar

## 2021-12-25 NOTE — Progress Notes (Signed)
HPI Patrick Lozano returns today for follow-up. He is a very pleasant 81 year old man with a history of chronic atrial fibrillation and complete heart block, status post permanent pacemaker insertion. He has done well in the interim except he feels some increased fatigue and weakness and dsypnea with exertion, especially when walking. His balance is a little bit off as well. Unchanged from last year. he was saddened by the loss of his wife 10 months ago who had cancer. 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  Allergies  Allergen Reactions   Lisinopril     "Very low blood pressure"     Current Outpatient Medications  Medication Sig Dispense Refill   atorvastatin (LIPITOR) 10 MG tablet Take 10 mg by mouth daily.     Cholecalciferol (VITAMIN D3) 50 MCG (2000 UT) TABS Take by mouth.     COVID-19 mRNA bivalent vaccine, Pfizer, (PFIZER COVID-19 VAC BIVALENT) injection Inject into the muscle. 0.3 mL 0   COVID-19 mRNA vaccine 2023-2024 (COMIRNATY) SUSP injection Inject into the muscle. 0.3 mL 0   fluticasone (FLONASE) 50 MCG/ACT nasal spray Place 1 spray into both nostrils 2 (two) times daily as needed for allergies.     glipiZIDE (GLUCOTROL) 10 MG tablet Take 5 mg by mouth 2 (two) times daily before a meal.      influenza vaccine adjuvanted (FLUAD) 0.5 ML injection Inject into the muscle. 0.5 mL 0   metFORMIN (GLUCOPHAGE) 500 MG tablet Take 1,000 mg by mouth 2 (two) times daily with a meal.      Multiple Vitamin (MULTIVITAMIN WITH MINERALS) TABS tablet Take 1 tablet by mouth daily with supper.      Omega-3 Fatty Acids (FISH OIL) 1000 MG CAPS Take 2,000 mg by mouth 2 (two) times daily.      verapamil (CALAN-SR) 240 MG CR tablet TAKE ONE TABLET BY MOUTH EVERYDAY AT BEDTIME 90 tablet 3   warfarin (COUMADIN) 10 MG tablet 10 mg daily at 6 PM. He takes one tablet everyday except on Wednesdays. He takes half a tablet on that one day.     No current facility-administered medications for this visit.      Past Medical History:  Diagnosis Date   Adenomatous colon polyp    Arthritis    hands, knees   Atrial fibrillation (HCC)    failed cardioversion and passed, rate control and anticoagulation    Diabetes mellitus without complication (HCC)    Dysrhythmia    Chronic Atrial Fibrillation- in and out   ED (erectile dysfunction)    does not desire treatment   Hyperlipidemia    Hypertension    Obesity, unspecified    Unspecified sleep apnea    Venous insufficiency    with lower extremity venous varicosities    ROS:   All systems reviewed and negative except as noted in the HPI.   Past Surgical History:  Procedure Laterality Date   COLONOSCOPY WITH PROPOFOL N/A 07/03/2015   Procedure: COLONOSCOPY WITH PROPOFOL;  Surgeon: Garlan Fair, MD;  Location: [facility];  Service: Endoscopy;  Laterality: N/A;   HERNIA REPAIR     inguinal   PACEMAKER INSERTION  09-22-2013   BSX single chamber pacemaker implanted by Dr Lovena Le for symptomatic bradycardia   PERMANENT PACEMAKER INSERTION N/A 09/22/2013   Procedure: PERMANENT PACEMAKER INSERTION;  Surgeon: Evans Lance, MD;  Location: [facility] CATH LAB;  Service: Cardiovascular;  Laterality: N/A;   VARICOSE VEIN SURGERY Bilateral  Family History  Problem Relation Age of Onset   CAD Mother    Breast cancer Mother    Diabetes Mother    CAD Father    Prostate cancer Father    Heart failure Brother    CAD Brother      Social History   Socioeconomic History   Marital status: Married    Spouse name: Not on file   Number of children: Not on file   Years of education: Not on file   Highest education level: Not on file  Occupational History   Not on file  Tobacco Use   Smoking status: Former    Types: Cigarettes    Quit date: 06/25/1985    Years since quitting: 36.5   Smokeless tobacco: Never  Vaping Use   Vaping Use: Never used  Substance and Sexual Activity   Alcohol use: No   Drug use: No   Sexual activity: Not on file   Other Topics Concern   Not on file  Social History Narrative   Not on file   Social Determinants of Health   Financial Resource Strain: Not on file  Food Insecurity: Not on file  Transportation Needs: Not on file  Physical Activity: Not on file  Stress: Not on file  Social Connections: Not on file  Intimate Partner Violence: Not on file     BP (!) 136/58 (BP Location: Left Arm)   Pulse 62   Resp 18   Wt 235 lb (106.6 kg)   BMI 31.00 kg/m   Physical Exam:  Well appearing 81 yo man,NAD HEENT: Unremarkable Neck:  No JVD, no thyromegally Lymphatics:  No adenopathy Back:  No CVA tenderness Lungs:  Clear with no wheezes HEART:  Regular rate rhythm, no murmurs, no rubs, no clicks Abd:  soft, positive bowel sounds, no organomegally, no rebound, no guarding Ext:  2 plus pulses, no edema, no cyanosis, no clubbing Skin:  No rashes no nodules Neuro:  CN II through XII intact, motor grossly intact  EKG  DEVICE  Normal device function.  See PaceArt for details.   Assess/Plan:  Atrial fib - his VR is well controlled and he is paced over 90% of the time.  Coags - he has not had any bleeding on coumadin. We will follow. HTN - his bp is controlled. He will continue verapamil. PPM - his [place] Sci single chamber PPM is working normally and we will recheck in several months.   Carleene Overlie Kynsie Falkner,MD

## 2022-01-02 DIAGNOSIS — Z7901 Long term (current) use of anticoagulants: Secondary | ICD-10-CM | POA: Diagnosis not present

## 2022-01-02 DIAGNOSIS — I10 Essential (primary) hypertension: Secondary | ICD-10-CM | POA: Diagnosis not present

## 2022-01-02 DIAGNOSIS — D6869 Other thrombophilia: Secondary | ICD-10-CM | POA: Diagnosis not present

## 2022-01-02 DIAGNOSIS — E1151 Type 2 diabetes mellitus with diabetic peripheral angiopathy without gangrene: Secondary | ICD-10-CM | POA: Diagnosis not present

## 2022-01-02 DIAGNOSIS — I2510 Atherosclerotic heart disease of native coronary artery without angina pectoris: Secondary | ICD-10-CM | POA: Diagnosis not present

## 2022-01-02 DIAGNOSIS — E669 Obesity, unspecified: Secondary | ICD-10-CM | POA: Diagnosis not present

## 2022-01-02 DIAGNOSIS — E785 Hyperlipidemia, unspecified: Secondary | ICD-10-CM | POA: Diagnosis not present

## 2022-01-02 DIAGNOSIS — H409 Unspecified glaucoma: Secondary | ICD-10-CM | POA: Diagnosis not present

## 2022-01-02 DIAGNOSIS — M1990 Unspecified osteoarthritis, unspecified site: Secondary | ICD-10-CM | POA: Diagnosis not present

## 2022-01-02 DIAGNOSIS — I4891 Unspecified atrial fibrillation: Secondary | ICD-10-CM | POA: Diagnosis not present

## 2022-01-02 DIAGNOSIS — G4733 Obstructive sleep apnea (adult) (pediatric): Secondary | ICD-10-CM | POA: Diagnosis not present

## 2022-01-02 DIAGNOSIS — N529 Male erectile dysfunction, unspecified: Secondary | ICD-10-CM | POA: Diagnosis not present

## 2022-01-03 DIAGNOSIS — G4733 Obstructive sleep apnea (adult) (pediatric): Secondary | ICD-10-CM | POA: Diagnosis not present

## 2022-01-14 DIAGNOSIS — Z7901 Long term (current) use of anticoagulants: Secondary | ICD-10-CM | POA: Diagnosis not present

## 2022-01-28 DIAGNOSIS — D6869 Other thrombophilia: Secondary | ICD-10-CM | POA: Diagnosis not present

## 2022-01-28 DIAGNOSIS — I482 Chronic atrial fibrillation, unspecified: Secondary | ICD-10-CM | POA: Diagnosis not present

## 2022-01-28 DIAGNOSIS — E782 Mixed hyperlipidemia: Secondary | ICD-10-CM | POA: Diagnosis not present

## 2022-01-28 DIAGNOSIS — R7309 Other abnormal glucose: Secondary | ICD-10-CM | POA: Diagnosis not present

## 2022-01-28 DIAGNOSIS — Z7901 Long term (current) use of anticoagulants: Secondary | ICD-10-CM | POA: Diagnosis not present

## 2022-01-28 DIAGNOSIS — E1169 Type 2 diabetes mellitus with other specified complication: Secondary | ICD-10-CM | POA: Diagnosis not present

## 2022-02-02 DIAGNOSIS — G4733 Obstructive sleep apnea (adult) (pediatric): Secondary | ICD-10-CM | POA: Diagnosis not present

## 2022-02-06 ENCOUNTER — Ambulatory Visit (INDEPENDENT_AMBULATORY_CARE_PROVIDER_SITE_OTHER): Payer: Medicare HMO

## 2022-02-06 DIAGNOSIS — I442 Atrioventricular block, complete: Secondary | ICD-10-CM

## 2022-02-07 LAB — CUP PACEART REMOTE DEVICE CHECK
Battery Remaining Longevity: 18 mo
Battery Remaining Percentage: 23 %
Brady Statistic RV Percent Paced: 100 %
Date Time Interrogation Session: 20231220150600
Implantable Lead Connection Status: 753985
Implantable Lead Implant Date: 20150805
Implantable Lead Location: 753860
Implantable Lead Model: 4137
Implantable Lead Serial Number: 29478499
Implantable Pulse Generator Implant Date: 20150805
Lead Channel Impedance Value: 719 Ohm
Lead Channel Pacing Threshold Amplitude: 1.1 V
Lead Channel Pacing Threshold Pulse Width: 0.4 ms
Lead Channel Setting Pacing Amplitude: 1.6 V
Lead Channel Setting Pacing Pulse Width: 0.4 ms
Lead Channel Setting Sensing Sensitivity: 2.5 mV
Pulse Gen Serial Number: 119862
Zone Setting Status: 755011

## 2022-02-20 ENCOUNTER — Other Ambulatory Visit (HOSPITAL_BASED_OUTPATIENT_CLINIC_OR_DEPARTMENT_OTHER): Payer: Self-pay

## 2022-02-20 MED ORDER — AREXVY 120 MCG/0.5ML IM SUSR
INTRAMUSCULAR | 0 refills | Status: AC
  Filled 2022-02-20: qty 0.5, 1d supply, fill #0

## 2022-02-21 DIAGNOSIS — L84 Corns and callosities: Secondary | ICD-10-CM | POA: Diagnosis not present

## 2022-02-21 DIAGNOSIS — I879 Disorder of vein, unspecified: Secondary | ICD-10-CM | POA: Diagnosis not present

## 2022-02-21 DIAGNOSIS — L602 Onychogryphosis: Secondary | ICD-10-CM | POA: Diagnosis not present

## 2022-02-21 DIAGNOSIS — E1151 Type 2 diabetes mellitus with diabetic peripheral angiopathy without gangrene: Secondary | ICD-10-CM | POA: Diagnosis not present

## 2022-02-21 DIAGNOSIS — B351 Tinea unguium: Secondary | ICD-10-CM | POA: Diagnosis not present

## 2022-02-22 ENCOUNTER — Other Ambulatory Visit (HOSPITAL_BASED_OUTPATIENT_CLINIC_OR_DEPARTMENT_OTHER): Payer: Self-pay

## 2022-02-28 DIAGNOSIS — G4700 Insomnia, unspecified: Secondary | ICD-10-CM | POA: Diagnosis not present

## 2022-02-28 DIAGNOSIS — E1169 Type 2 diabetes mellitus with other specified complication: Secondary | ICD-10-CM | POA: Diagnosis not present

## 2022-02-28 DIAGNOSIS — E782 Mixed hyperlipidemia: Secondary | ICD-10-CM | POA: Diagnosis not present

## 2022-02-28 DIAGNOSIS — M154 Erosive (osteo)arthritis: Secondary | ICD-10-CM | POA: Diagnosis not present

## 2022-02-28 DIAGNOSIS — I482 Chronic atrial fibrillation, unspecified: Secondary | ICD-10-CM | POA: Diagnosis not present

## 2022-03-04 NOTE — Progress Notes (Signed)
Remote pacemaker transmission.   

## 2022-03-18 DIAGNOSIS — Z7901 Long term (current) use of anticoagulants: Secondary | ICD-10-CM | POA: Diagnosis not present

## 2022-03-21 DIAGNOSIS — H401131 Primary open-angle glaucoma, bilateral, mild stage: Secondary | ICD-10-CM | POA: Diagnosis not present

## 2022-03-23 DIAGNOSIS — R69 Illness, unspecified: Secondary | ICD-10-CM | POA: Diagnosis not present

## 2022-04-01 DIAGNOSIS — G4733 Obstructive sleep apnea (adult) (pediatric): Secondary | ICD-10-CM | POA: Diagnosis not present

## 2022-04-12 DIAGNOSIS — R69 Illness, unspecified: Secondary | ICD-10-CM | POA: Diagnosis not present

## 2022-04-15 DIAGNOSIS — Z7901 Long term (current) use of anticoagulants: Secondary | ICD-10-CM | POA: Diagnosis not present

## 2022-04-30 DIAGNOSIS — G4733 Obstructive sleep apnea (adult) (pediatric): Secondary | ICD-10-CM | POA: Diagnosis not present

## 2022-05-01 DIAGNOSIS — H401131 Primary open-angle glaucoma, bilateral, mild stage: Secondary | ICD-10-CM | POA: Diagnosis not present

## 2022-05-01 DIAGNOSIS — B351 Tinea unguium: Secondary | ICD-10-CM | POA: Diagnosis not present

## 2022-05-01 DIAGNOSIS — L84 Corns and callosities: Secondary | ICD-10-CM | POA: Diagnosis not present

## 2022-05-01 DIAGNOSIS — I879 Disorder of vein, unspecified: Secondary | ICD-10-CM | POA: Diagnosis not present

## 2022-05-01 DIAGNOSIS — E1151 Type 2 diabetes mellitus with diabetic peripheral angiopathy without gangrene: Secondary | ICD-10-CM | POA: Diagnosis not present

## 2022-05-01 DIAGNOSIS — L602 Onychogryphosis: Secondary | ICD-10-CM | POA: Diagnosis not present

## 2022-05-20 ENCOUNTER — Ambulatory Visit (INDEPENDENT_AMBULATORY_CARE_PROVIDER_SITE_OTHER): Payer: Medicare HMO

## 2022-05-20 DIAGNOSIS — I442 Atrioventricular block, complete: Secondary | ICD-10-CM | POA: Diagnosis not present

## 2022-05-21 LAB — CUP PACEART REMOTE DEVICE CHECK
Battery Remaining Longevity: 18 mo
Battery Remaining Percentage: 20 %
Brady Statistic RV Percent Paced: 99 %
Date Time Interrogation Session: 20240402072700
Implantable Lead Connection Status: 753985
Implantable Lead Implant Date: 20150805
Implantable Lead Location: 753860
Implantable Lead Model: 4137
Implantable Lead Serial Number: 29478499
Implantable Pulse Generator Implant Date: 20150805
Lead Channel Impedance Value: 754 Ohm
Lead Channel Pacing Threshold Amplitude: 1.1 V
Lead Channel Pacing Threshold Pulse Width: 0.4 ms
Lead Channel Setting Pacing Amplitude: 1.6 V
Lead Channel Setting Pacing Pulse Width: 0.4 ms
Lead Channel Setting Sensing Sensitivity: 2.5 mV
Pulse Gen Serial Number: 119862
Zone Setting Status: 755011

## 2022-05-27 DIAGNOSIS — E782 Mixed hyperlipidemia: Secondary | ICD-10-CM | POA: Diagnosis not present

## 2022-05-27 DIAGNOSIS — I482 Chronic atrial fibrillation, unspecified: Secondary | ICD-10-CM | POA: Diagnosis not present

## 2022-05-27 DIAGNOSIS — K625 Hemorrhage of anus and rectum: Secondary | ICD-10-CM | POA: Diagnosis not present

## 2022-05-27 DIAGNOSIS — R030 Elevated blood-pressure reading, without diagnosis of hypertension: Secondary | ICD-10-CM | POA: Diagnosis not present

## 2022-05-27 DIAGNOSIS — E1169 Type 2 diabetes mellitus with other specified complication: Secondary | ICD-10-CM | POA: Diagnosis not present

## 2022-05-27 DIAGNOSIS — E119 Type 2 diabetes mellitus without complications: Secondary | ICD-10-CM | POA: Diagnosis not present

## 2022-05-27 DIAGNOSIS — Z7901 Long term (current) use of anticoagulants: Secondary | ICD-10-CM | POA: Diagnosis not present

## 2022-05-31 DIAGNOSIS — G4733 Obstructive sleep apnea (adult) (pediatric): Secondary | ICD-10-CM | POA: Diagnosis not present

## 2022-06-25 NOTE — Progress Notes (Signed)
Remote pacemaker transmission.   

## 2022-06-26 DIAGNOSIS — Z7901 Long term (current) use of anticoagulants: Secondary | ICD-10-CM | POA: Diagnosis not present

## 2022-07-04 DIAGNOSIS — G4733 Obstructive sleep apnea (adult) (pediatric): Secondary | ICD-10-CM | POA: Diagnosis not present

## 2022-07-16 DIAGNOSIS — L602 Onychogryphosis: Secondary | ICD-10-CM | POA: Diagnosis not present

## 2022-07-16 DIAGNOSIS — B351 Tinea unguium: Secondary | ICD-10-CM | POA: Diagnosis not present

## 2022-07-16 DIAGNOSIS — E1151 Type 2 diabetes mellitus with diabetic peripheral angiopathy without gangrene: Secondary | ICD-10-CM | POA: Diagnosis not present

## 2022-07-16 DIAGNOSIS — L84 Corns and callosities: Secondary | ICD-10-CM | POA: Diagnosis not present

## 2022-07-16 DIAGNOSIS — I879 Disorder of vein, unspecified: Secondary | ICD-10-CM | POA: Diagnosis not present

## 2022-07-26 DIAGNOSIS — Z7901 Long term (current) use of anticoagulants: Secondary | ICD-10-CM | POA: Diagnosis not present

## 2022-08-04 DIAGNOSIS — G4733 Obstructive sleep apnea (adult) (pediatric): Secondary | ICD-10-CM | POA: Diagnosis not present

## 2022-08-16 DIAGNOSIS — D6869 Other thrombophilia: Secondary | ICD-10-CM | POA: Diagnosis not present

## 2022-08-16 DIAGNOSIS — N529 Male erectile dysfunction, unspecified: Secondary | ICD-10-CM | POA: Diagnosis not present

## 2022-08-16 DIAGNOSIS — I4891 Unspecified atrial fibrillation: Secondary | ICD-10-CM | POA: Diagnosis not present

## 2022-08-16 DIAGNOSIS — Z008 Encounter for other general examination: Secondary | ICD-10-CM | POA: Diagnosis not present

## 2022-08-16 DIAGNOSIS — Z7984 Long term (current) use of oral hypoglycemic drugs: Secondary | ICD-10-CM | POA: Diagnosis not present

## 2022-08-16 DIAGNOSIS — H401134 Primary open-angle glaucoma, bilateral, indeterminate stage: Secondary | ICD-10-CM | POA: Diagnosis not present

## 2022-08-16 DIAGNOSIS — I10 Essential (primary) hypertension: Secondary | ICD-10-CM | POA: Diagnosis not present

## 2022-08-16 DIAGNOSIS — Z87891 Personal history of nicotine dependence: Secondary | ICD-10-CM | POA: Diagnosis not present

## 2022-08-16 DIAGNOSIS — I2510 Atherosclerotic heart disease of native coronary artery without angina pectoris: Secondary | ICD-10-CM | POA: Diagnosis not present

## 2022-08-16 DIAGNOSIS — Z809 Family history of malignant neoplasm, unspecified: Secondary | ICD-10-CM | POA: Diagnosis not present

## 2022-08-16 DIAGNOSIS — E669 Obesity, unspecified: Secondary | ICD-10-CM | POA: Diagnosis not present

## 2022-08-16 DIAGNOSIS — H43813 Vitreous degeneration, bilateral: Secondary | ICD-10-CM | POA: Diagnosis not present

## 2022-08-16 DIAGNOSIS — H5203 Hypermetropia, bilateral: Secondary | ICD-10-CM | POA: Diagnosis not present

## 2022-08-16 DIAGNOSIS — E119 Type 2 diabetes mellitus without complications: Secondary | ICD-10-CM | POA: Diagnosis not present

## 2022-08-16 DIAGNOSIS — H524 Presbyopia: Secondary | ICD-10-CM | POA: Diagnosis not present

## 2022-08-16 DIAGNOSIS — E785 Hyperlipidemia, unspecified: Secondary | ICD-10-CM | POA: Diagnosis not present

## 2022-08-16 DIAGNOSIS — H52203 Unspecified astigmatism, bilateral: Secondary | ICD-10-CM | POA: Diagnosis not present

## 2022-08-16 DIAGNOSIS — G4733 Obstructive sleep apnea (adult) (pediatric): Secondary | ICD-10-CM | POA: Diagnosis not present

## 2022-08-19 ENCOUNTER — Ambulatory Visit: Payer: Medicare HMO

## 2022-08-19 DIAGNOSIS — I442 Atrioventricular block, complete: Secondary | ICD-10-CM | POA: Diagnosis not present

## 2022-08-20 LAB — CUP PACEART REMOTE DEVICE CHECK
Battery Remaining Longevity: 12 mo
Battery Remaining Percentage: 13 %
Brady Statistic RV Percent Paced: 99 %
Date Time Interrogation Session: 20240701030100
Implantable Lead Connection Status: 753985
Implantable Lead Implant Date: 20150805
Implantable Lead Location: 753860
Implantable Lead Model: 4137
Implantable Lead Serial Number: 29478499
Implantable Pulse Generator Implant Date: 20150805
Lead Channel Impedance Value: 764 Ohm
Lead Channel Pacing Threshold Amplitude: 1 V
Lead Channel Pacing Threshold Pulse Width: 0.4 ms
Lead Channel Setting Pacing Amplitude: 1.5 V
Lead Channel Setting Pacing Pulse Width: 0.4 ms
Lead Channel Setting Sensing Sensitivity: 2.5 mV
Pulse Gen Serial Number: 119862
Zone Setting Status: 755011

## 2022-09-03 DIAGNOSIS — G4733 Obstructive sleep apnea (adult) (pediatric): Secondary | ICD-10-CM | POA: Diagnosis not present

## 2022-09-04 NOTE — Progress Notes (Signed)
 Remote pacemaker transmission.   

## 2022-09-06 DIAGNOSIS — Z7901 Long term (current) use of anticoagulants: Secondary | ICD-10-CM | POA: Diagnosis not present

## 2022-09-26 DIAGNOSIS — L84 Corns and callosities: Secondary | ICD-10-CM | POA: Diagnosis not present

## 2022-09-26 DIAGNOSIS — I879 Disorder of vein, unspecified: Secondary | ICD-10-CM | POA: Diagnosis not present

## 2022-09-26 DIAGNOSIS — B351 Tinea unguium: Secondary | ICD-10-CM | POA: Diagnosis not present

## 2022-09-26 DIAGNOSIS — L602 Onychogryphosis: Secondary | ICD-10-CM | POA: Diagnosis not present

## 2022-09-26 DIAGNOSIS — E1151 Type 2 diabetes mellitus with diabetic peripheral angiopathy without gangrene: Secondary | ICD-10-CM | POA: Diagnosis not present

## 2022-10-02 DIAGNOSIS — G4733 Obstructive sleep apnea (adult) (pediatric): Secondary | ICD-10-CM | POA: Diagnosis not present

## 2022-10-03 ENCOUNTER — Other Ambulatory Visit: Payer: Self-pay

## 2022-10-08 ENCOUNTER — Other Ambulatory Visit: Payer: Self-pay | Admitting: Interventional Cardiology

## 2022-10-14 DIAGNOSIS — Z7901 Long term (current) use of anticoagulants: Secondary | ICD-10-CM | POA: Diagnosis not present

## 2022-11-02 DIAGNOSIS — G4733 Obstructive sleep apnea (adult) (pediatric): Secondary | ICD-10-CM | POA: Diagnosis not present

## 2022-11-07 DIAGNOSIS — I442 Atrioventricular block, complete: Secondary | ICD-10-CM | POA: Diagnosis not present

## 2022-11-07 DIAGNOSIS — D6869 Other thrombophilia: Secondary | ICD-10-CM | POA: Diagnosis not present

## 2022-11-07 DIAGNOSIS — Z0000 Encounter for general adult medical examination without abnormal findings: Secondary | ICD-10-CM | POA: Diagnosis not present

## 2022-11-07 DIAGNOSIS — E1169 Type 2 diabetes mellitus with other specified complication: Secondary | ICD-10-CM | POA: Diagnosis not present

## 2022-11-07 DIAGNOSIS — E119 Type 2 diabetes mellitus without complications: Secondary | ICD-10-CM | POA: Diagnosis not present

## 2022-11-07 DIAGNOSIS — I482 Chronic atrial fibrillation, unspecified: Secondary | ICD-10-CM | POA: Diagnosis not present

## 2022-11-07 DIAGNOSIS — Z23 Encounter for immunization: Secondary | ICD-10-CM | POA: Diagnosis not present

## 2022-11-07 DIAGNOSIS — Z1331 Encounter for screening for depression: Secondary | ICD-10-CM | POA: Diagnosis not present

## 2022-11-07 DIAGNOSIS — Z7901 Long term (current) use of anticoagulants: Secondary | ICD-10-CM | POA: Diagnosis not present

## 2022-11-07 DIAGNOSIS — E669 Obesity, unspecified: Secondary | ICD-10-CM | POA: Diagnosis not present

## 2022-11-07 DIAGNOSIS — Z950 Presence of cardiac pacemaker: Secondary | ICD-10-CM | POA: Diagnosis not present

## 2022-11-07 DIAGNOSIS — G4733 Obstructive sleep apnea (adult) (pediatric): Secondary | ICD-10-CM | POA: Diagnosis not present

## 2022-11-07 DIAGNOSIS — H401131 Primary open-angle glaucoma, bilateral, mild stage: Secondary | ICD-10-CM | POA: Diagnosis not present

## 2022-11-07 DIAGNOSIS — E782 Mixed hyperlipidemia: Secondary | ICD-10-CM | POA: Diagnosis not present

## 2022-11-18 ENCOUNTER — Ambulatory Visit (INDEPENDENT_AMBULATORY_CARE_PROVIDER_SITE_OTHER): Payer: Medicare HMO

## 2022-11-18 ENCOUNTER — Ambulatory Visit: Payer: Medicare HMO

## 2022-11-18 DIAGNOSIS — I442 Atrioventricular block, complete: Secondary | ICD-10-CM

## 2022-11-18 DIAGNOSIS — I4891 Unspecified atrial fibrillation: Secondary | ICD-10-CM

## 2022-11-18 LAB — CUP PACEART REMOTE DEVICE CHECK
Battery Remaining Longevity: 6 mo
Battery Remaining Longevity: 6 mo
Battery Remaining Percentage: 6 %
Battery Remaining Percentage: 6 %
Brady Statistic RV Percent Paced: 99 %
Brady Statistic RV Percent Paced: 99 %
Date Time Interrogation Session: 20240930030100
Date Time Interrogation Session: 20240930030100
Implantable Lead Connection Status: 753985
Implantable Lead Connection Status: 753985
Implantable Lead Implant Date: 20150805
Implantable Lead Implant Date: 20150805
Implantable Lead Location: 753860
Implantable Lead Location: 753860
Implantable Lead Model: 4137
Implantable Lead Model: 4137
Implantable Lead Serial Number: 29478499
Implantable Lead Serial Number: 29478499
Implantable Pulse Generator Implant Date: 20150805
Implantable Pulse Generator Implant Date: 20150805
Lead Channel Impedance Value: 751 Ohm
Lead Channel Impedance Value: 751 Ohm
Lead Channel Pacing Threshold Amplitude: 1.4 V
Lead Channel Pacing Threshold Amplitude: 1.4 V
Lead Channel Pacing Threshold Pulse Width: 0.4 ms
Lead Channel Pacing Threshold Pulse Width: 0.4 ms
Lead Channel Setting Pacing Amplitude: 1.8 V
Lead Channel Setting Pacing Amplitude: 1.8 V
Lead Channel Setting Pacing Pulse Width: 0.4 ms
Lead Channel Setting Pacing Pulse Width: 0.4 ms
Lead Channel Setting Sensing Sensitivity: 2.5 mV
Lead Channel Setting Sensing Sensitivity: 2.5 mV
Pulse Gen Serial Number: 119862
Pulse Gen Serial Number: 119862
Zone Setting Status: 755011
Zone Setting Status: 755011

## 2022-12-02 DIAGNOSIS — G4733 Obstructive sleep apnea (adult) (pediatric): Secondary | ICD-10-CM | POA: Diagnosis not present

## 2022-12-02 NOTE — Progress Notes (Signed)
Remote pacemaker transmission.

## 2022-12-10 ENCOUNTER — Other Ambulatory Visit (HOSPITAL_BASED_OUTPATIENT_CLINIC_OR_DEPARTMENT_OTHER): Payer: Self-pay

## 2022-12-10 MED ORDER — COVID-19 MRNA VAC-TRIS(PFIZER) 30 MCG/0.3ML IM SUSY
0.3000 mL | PREFILLED_SYRINGE | Freq: Once | INTRAMUSCULAR | 0 refills | Status: AC
  Filled 2022-12-10: qty 0.3, 1d supply, fill #0

## 2022-12-12 DIAGNOSIS — L84 Corns and callosities: Secondary | ICD-10-CM | POA: Diagnosis not present

## 2022-12-12 DIAGNOSIS — I739 Peripheral vascular disease, unspecified: Secondary | ICD-10-CM | POA: Diagnosis not present

## 2022-12-12 DIAGNOSIS — E1151 Type 2 diabetes mellitus with diabetic peripheral angiopathy without gangrene: Secondary | ICD-10-CM | POA: Diagnosis not present

## 2022-12-12 DIAGNOSIS — I879 Disorder of vein, unspecified: Secondary | ICD-10-CM | POA: Diagnosis not present

## 2022-12-12 DIAGNOSIS — L602 Onychogryphosis: Secondary | ICD-10-CM | POA: Diagnosis not present

## 2022-12-12 DIAGNOSIS — B351 Tinea unguium: Secondary | ICD-10-CM | POA: Diagnosis not present

## 2022-12-17 DIAGNOSIS — L219 Seborrheic dermatitis, unspecified: Secondary | ICD-10-CM | POA: Diagnosis not present

## 2022-12-17 DIAGNOSIS — L821 Other seborrheic keratosis: Secondary | ICD-10-CM | POA: Diagnosis not present

## 2022-12-17 DIAGNOSIS — Z85828 Personal history of other malignant neoplasm of skin: Secondary | ICD-10-CM | POA: Diagnosis not present

## 2022-12-17 DIAGNOSIS — D225 Melanocytic nevi of trunk: Secondary | ICD-10-CM | POA: Diagnosis not present

## 2022-12-17 DIAGNOSIS — L814 Other melanin hyperpigmentation: Secondary | ICD-10-CM | POA: Diagnosis not present

## 2022-12-17 DIAGNOSIS — L578 Other skin changes due to chronic exposure to nonionizing radiation: Secondary | ICD-10-CM | POA: Diagnosis not present

## 2022-12-17 DIAGNOSIS — L570 Actinic keratosis: Secondary | ICD-10-CM | POA: Diagnosis not present

## 2022-12-18 DIAGNOSIS — Z7901 Long term (current) use of anticoagulants: Secondary | ICD-10-CM | POA: Diagnosis not present

## 2022-12-26 ENCOUNTER — Other Ambulatory Visit (HOSPITAL_BASED_OUTPATIENT_CLINIC_OR_DEPARTMENT_OTHER): Payer: Self-pay

## 2022-12-31 DIAGNOSIS — G4733 Obstructive sleep apnea (adult) (pediatric): Secondary | ICD-10-CM | POA: Diagnosis not present

## 2023-01-20 ENCOUNTER — Ambulatory Visit (INDEPENDENT_AMBULATORY_CARE_PROVIDER_SITE_OTHER): Payer: Medicare HMO

## 2023-01-20 DIAGNOSIS — I442 Atrioventricular block, complete: Secondary | ICD-10-CM

## 2023-01-20 LAB — CUP PACEART REMOTE DEVICE CHECK
Battery Remaining Longevity: 6 mo
Battery Remaining Percentage: 7 %
Brady Statistic RV Percent Paced: 99 %
Date Time Interrogation Session: 20241201165900
Implantable Lead Connection Status: 753985
Implantable Lead Implant Date: 20150805
Implantable Lead Location: 753860
Implantable Lead Model: 4137
Implantable Lead Serial Number: 29478499
Implantable Pulse Generator Implant Date: 20150805
Lead Channel Impedance Value: 751 Ohm
Lead Channel Pacing Threshold Amplitude: 1.2 V
Lead Channel Pacing Threshold Pulse Width: 0.4 ms
Lead Channel Setting Pacing Amplitude: 1.7 V
Lead Channel Setting Pacing Pulse Width: 0.4 ms
Lead Channel Setting Sensing Sensitivity: 2.5 mV
Pulse Gen Serial Number: 119862
Zone Setting Status: 755011

## 2023-01-21 ENCOUNTER — Ambulatory Visit: Payer: Medicare HMO | Attending: Internal Medicine | Admitting: Internal Medicine

## 2023-01-21 ENCOUNTER — Other Ambulatory Visit: Payer: Self-pay | Admitting: Interventional Cardiology

## 2023-01-21 ENCOUNTER — Encounter: Payer: Self-pay | Admitting: Internal Medicine

## 2023-01-21 VITALS — BP 152/80 | HR 60 | Ht 73.0 in | Wt 228.0 lb

## 2023-01-21 DIAGNOSIS — I4891 Unspecified atrial fibrillation: Secondary | ICD-10-CM | POA: Diagnosis not present

## 2023-01-21 DIAGNOSIS — I442 Atrioventricular block, complete: Secondary | ICD-10-CM

## 2023-01-21 DIAGNOSIS — Z7901 Long term (current) use of anticoagulants: Secondary | ICD-10-CM | POA: Diagnosis not present

## 2023-01-21 NOTE — Progress Notes (Signed)
HPI Patrick Lozano returns today for follow-up. He is a very pleasant 82 year old man with a history of chronic atrial fibrillation and complete heart block, status post permanent pacemaker insertion. He has done well in the interim except he feels some increased fatigue and weakness and dsypnea with exertion, especially when walking. His balance is a little bit off as well. Unchanged from last year. 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  Allergies  Allergen Reactions   Lisinopril     "Very low blood pressure"     Current Outpatient Medications  Medication Sig Dispense Refill   atorvastatin (LIPITOR) 10 MG tablet Take 10 mg by mouth daily.     Cholecalciferol (VITAMIN D3) 50 MCG (2000 UT) TABS Take by mouth.     COVID-19 mRNA bivalent vaccine, Pfizer, (PFIZER COVID-19 VAC BIVALENT) injection Inject into the muscle. 0.3 mL 0   COVID-19 mRNA vaccine 2023-2024 (COMIRNATY) SUSP injection Inject into the muscle. 0.3 mL 0   fluticasone (FLONASE) 50 MCG/ACT nasal spray Place 1 spray into both nostrils 2 (two) times daily as needed for allergies.     glipiZIDE (GLUCOTROL) 10 MG tablet Take 5 mg by mouth 2 (two) times daily before a meal.      influenza vaccine adjuvanted (FLUAD) 0.5 ML injection Inject into the muscle. 0.5 mL 0   metFORMIN (GLUCOPHAGE) 500 MG tablet Take 1,000 mg by mouth 2 (two) times daily with a meal.      Multiple Vitamin (MULTIVITAMIN WITH MINERALS) TABS tablet Take 1 tablet by mouth daily with supper.      Omega-3 Fatty Acids (FISH OIL) 1000 MG CAPS Take 2,000 mg by mouth 2 (two) times daily.      RSV vaccine recomb adjuvanted (AREXVY) 120 MCG/0.5ML injection Inject into the muscle. 0.5 mL 0   verapamil (CALAN-SR) 240 MG CR tablet TAKE ONE TABLET BY MOUTH EVERYDAY AT BEDTIME 30 tablet 0   warfarin (COUMADIN) 10 MG tablet 10 mg daily at 6 PM. He takes one tablet everyday except on Wednesdays. He takes half a tablet on that one day.     No current facility-administered  medications for this visit.     Past Medical History:  Diagnosis Date   Adenomatous colon polyp    Arthritis    hands, knees   Atrial fibrillation (HCC)    failed cardioversion and passed, rate control and anticoagulation    Diabetes mellitus without complication (HCC)    Dysrhythmia    Chronic Atrial Fibrillation- in and out   ED (erectile dysfunction)    does not desire treatment   Hyperlipidemia    Hypertension    Obesity, unspecified    Unspecified sleep apnea    Venous insufficiency    with lower extremity venous varicosities    ROS:   All systems reviewed and negative except as noted in the HPI.   Past Surgical History:  Procedure Laterality Date   COLONOSCOPY WITH PROPOFOL N/A 07/03/2015   Procedure: COLONOSCOPY WITH PROPOFOL;  Surgeon: Charolett Bumpers, MD;  Location: [facility];  Service: Endoscopy;  Laterality: N/A;   HERNIA REPAIR     inguinal   PACEMAKER INSERTION  09-22-2013   BSX single chamber pacemaker implanted by Dr Ladona Ridgel for symptomatic bradycardia   PERMANENT PACEMAKER INSERTION N/A 09/22/2013   Procedure: PERMANENT PACEMAKER INSERTION;  Surgeon: Marinus Maw, MD;  Location: [facility] CATH LAB;  Service: Cardiovascular;  Laterality: N/A;   VARICOSE VEIN SURGERY Bilateral  Family History  Problem Relation Age of Onset   CAD Mother    Breast cancer Mother    Diabetes Mother    CAD Father    Prostate cancer Father    Heart failure Brother    CAD Brother      Social History   Socioeconomic History   Marital status: Married    Spouse name: Not on file   Number of children: Not on file   Years of education: Not on file   Highest education level: Not on file  Occupational History   Not on file  Tobacco Use   Smoking status: Former    Current packs/day: 0.00    Types: Cigarettes    Quit date: 06/25/1985    Years since quitting: 37.6   Smokeless tobacco: Never  Vaping Use   Vaping status: Never Used  Substance and Sexual Activity    Alcohol use: No   Drug use: No   Sexual activity: Not on file  Other Topics Concern   Not on file  Social History Narrative   Not on file   Social Determinants of Health   Financial Resource Strain: Not on file  Food Insecurity: Not on file  Transportation Needs: Not on file  Physical Activity: Not on file  Stress: Not on file  Social Connections: Not on file  Intimate Partner Violence: Not on file     BP (!) 152/80   Pulse 60   Ht 6\' 1"  (1.854 m)   Wt 228 lb (103.4 kg)   SpO2 99%   BMI 30.08 kg/m   Physical Exam:  Well appearing NAD HEENT: Unremarkable Neck:  No JVD, no thyromegally Lymphatics:  No adenopathy Back:  No CVA tenderness Lungs:  Clear with no wheezes HEART:  Regular rate rhythm, no murmurs, no rubs, no clicks Abd:  soft, positive bowel sounds, no organomegally, no rebound, no guarding Ext:  2 plus pulses, no edema, no cyanosis, no clubbing Skin:  No rashes no nodules Neuro:  CN II through XII intact, motor grossly intact  EKG - atrial fib with ventricular pacing  DEVICE  Normal device function.  See PaceArt for details.   Assess/Plan:  Atrial fib - his VR is well controlled and he is paced 100% of the time.  Coags - he has not had any bleeding on coumadin. We will follow. HTN - his bp is controlled. He will continue verapamil. PPM - his [place] Sci single chamber PPM is working normally and we will recheck in several months. He is about 6 months from ERI.   Patrick Gowda Nikaya Nasby,MD

## 2023-01-21 NOTE — Patient Instructions (Addendum)
Medication Instructions:  Your physician recommends that you continue on your current medications as directed. Please refer to the Current Medication list given to you today.  *If you need a refill on your cardiac medications before your next appointment, please call your pharmacy*  Lab Work: None ordered.  If you have labs (blood work) drawn today and your tests are completely normal, you will receive your results only by: MyChart Message (if you have MyChart) OR A paper copy in the mail If you have any lab test that is abnormal or we need to change your treatment, we will call you to review the results.  Testing/Procedures: None ordered.  Follow-Up: At [facility], you and your health needs are our priority.  As part of our continuing mission to provide you with exceptional heart care, we have created designated Provider Care Teams.  These Care Teams include your primary Cardiologist (physician) and Advanced Practice Providers (APPs -  Physician Assistants and Nurse Practitioners) who all work together to provide you with the care you need, when you need it.   Your next appointment:   9  months  The format for your next appointment:   In Person  Provider:   Lewayne Bunting, MD{or one of the following Advanced Practice Providers on your designated Care Team:   Francis Dowse, [place] Casimiro Needle "Mardelle Matte" [place], [place] Earnest Rosier, NP  Remote monitoring is used to monitor your Pacemaker/ ICD from home. This monitoring reduces the number of office visits required to check your device to one time per year. It allows [place] to keep an eye on the functioning of your device to ensure it is working properly.  Important Information About Sugar

## 2023-01-30 DIAGNOSIS — Z7901 Long term (current) use of anticoagulants: Secondary | ICD-10-CM | POA: Diagnosis not present

## 2023-02-01 ENCOUNTER — Other Ambulatory Visit: Payer: Self-pay | Admitting: Nurse Practitioner

## 2023-02-03 ENCOUNTER — Other Ambulatory Visit: Payer: Self-pay

## 2023-02-03 MED ORDER — VERAPAMIL HCL ER 240 MG PO TBCR: 240.0000 mg | EXTENDED_RELEASE_TABLET | Freq: Every day | ORAL | 3 refills | Status: AC

## 2023-02-14 ENCOUNTER — Telehealth: Payer: Self-pay | Admitting: Internal Medicine

## 2023-02-14 NOTE — Telephone Encounter (Signed)
LMOVM letting pt know that the office is close on 02/20/2023. If he has a date after the 02/20/2023 he would like to do his remote check to give the device clinic a call.

## 2023-02-14 NOTE — Telephone Encounter (Signed)
Pt called in stating he will not be in on 02/20/23 for device check and he would like it changed to 02/19/23

## 2023-02-17 DIAGNOSIS — Z7901 Long term (current) use of anticoagulants: Secondary | ICD-10-CM | POA: Diagnosis not present

## 2023-02-17 NOTE — Telephone Encounter (Signed)
Call back received from Pt.  At last check in December 2024-Pt with 6 months on battery.  Advised Pt will cancel 02/20/2023 remote.  Next remote scheduled in February.

## 2023-02-18 DIAGNOSIS — B351 Tinea unguium: Secondary | ICD-10-CM | POA: Diagnosis not present

## 2023-02-18 DIAGNOSIS — L84 Corns and callosities: Secondary | ICD-10-CM | POA: Diagnosis not present

## 2023-02-18 DIAGNOSIS — E1151 Type 2 diabetes mellitus with diabetic peripheral angiopathy without gangrene: Secondary | ICD-10-CM | POA: Diagnosis not present

## 2023-02-18 DIAGNOSIS — L602 Onychogryphosis: Secondary | ICD-10-CM | POA: Diagnosis not present

## 2023-02-18 DIAGNOSIS — I879 Disorder of vein, unspecified: Secondary | ICD-10-CM | POA: Diagnosis not present

## 2023-02-20 ENCOUNTER — Ambulatory Visit (INDEPENDENT_AMBULATORY_CARE_PROVIDER_SITE_OTHER): Payer: Medicare HMO

## 2023-02-20 DIAGNOSIS — I442 Atrioventricular block, complete: Secondary | ICD-10-CM

## 2023-02-20 LAB — CUP PACEART REMOTE DEVICE CHECK
Battery Remaining Longevity: 3 mo
Battery Remaining Percentage: 3 %
Brady Statistic RV Percent Paced: 99 %
Date Time Interrogation Session: 20250102030100
Implantable Lead Connection Status: 753985
Implantable Lead Implant Date: 20150805
Implantable Lead Location: 753860
Implantable Lead Model: 4137
Implantable Lead Serial Number: 29478499
Implantable Pulse Generator Implant Date: 20150805
Lead Channel Impedance Value: 732 Ohm
Lead Channel Pacing Threshold Amplitude: 1.6 V
Lead Channel Pacing Threshold Pulse Width: 0.4 ms
Lead Channel Setting Pacing Amplitude: 1.9 V
Lead Channel Setting Pacing Pulse Width: 0.4 ms
Lead Channel Setting Sensing Sensitivity: 2.5 mV
Pulse Gen Serial Number: 119862
Zone Setting Status: 755011

## 2023-02-27 DIAGNOSIS — H401131 Primary open-angle glaucoma, bilateral, mild stage: Secondary | ICD-10-CM | POA: Diagnosis not present

## 2023-03-24 ENCOUNTER — Ambulatory Visit (INDEPENDENT_AMBULATORY_CARE_PROVIDER_SITE_OTHER): Payer: Medicare HMO

## 2023-03-24 DIAGNOSIS — I442 Atrioventricular block, complete: Secondary | ICD-10-CM

## 2023-03-25 ENCOUNTER — Encounter: Payer: Self-pay | Admitting: Internal Medicine

## 2023-03-25 LAB — CUP PACEART REMOTE DEVICE CHECK
Battery Remaining Longevity: 3 mo
Battery Remaining Percentage: 4 %
Brady Statistic RV Percent Paced: 99 %
Date Time Interrogation Session: 20250203074900
Implantable Lead Connection Status: 753985
Implantable Lead Implant Date: 20150805
Implantable Lead Location: 753860
Implantable Lead Model: 4137
Implantable Lead Serial Number: 29478499
Implantable Pulse Generator Implant Date: 20150805
Lead Channel Impedance Value: 733 Ohm
Lead Channel Pacing Threshold Amplitude: 1.1 V
Lead Channel Pacing Threshold Pulse Width: 0.4 ms
Lead Channel Setting Pacing Amplitude: 1.5 V
Lead Channel Setting Pacing Pulse Width: 0.4 ms
Lead Channel Setting Sensing Sensitivity: 2.5 mV
Pulse Gen Serial Number: 119862
Zone Setting Status: 755011

## 2023-03-27 DIAGNOSIS — Z7901 Long term (current) use of anticoagulants: Secondary | ICD-10-CM | POA: Diagnosis not present

## 2023-03-31 DIAGNOSIS — Z7901 Long term (current) use of anticoagulants: Secondary | ICD-10-CM | POA: Diagnosis not present

## 2023-04-01 DIAGNOSIS — E785 Hyperlipidemia, unspecified: Secondary | ICD-10-CM | POA: Diagnosis not present

## 2023-04-01 DIAGNOSIS — I10 Essential (primary) hypertension: Secondary | ICD-10-CM | POA: Diagnosis not present

## 2023-04-01 DIAGNOSIS — Z008 Encounter for other general examination: Secondary | ICD-10-CM | POA: Diagnosis not present

## 2023-04-01 DIAGNOSIS — Z7901 Long term (current) use of anticoagulants: Secondary | ICD-10-CM | POA: Diagnosis not present

## 2023-04-01 DIAGNOSIS — G4733 Obstructive sleep apnea (adult) (pediatric): Secondary | ICD-10-CM | POA: Diagnosis not present

## 2023-04-01 DIAGNOSIS — I4891 Unspecified atrial fibrillation: Secondary | ICD-10-CM | POA: Diagnosis not present

## 2023-04-01 DIAGNOSIS — Z8249 Family history of ischemic heart disease and other diseases of the circulatory system: Secondary | ICD-10-CM | POA: Diagnosis not present

## 2023-04-01 DIAGNOSIS — R32 Unspecified urinary incontinence: Secondary | ICD-10-CM | POA: Diagnosis not present

## 2023-04-01 DIAGNOSIS — D6869 Other thrombophilia: Secondary | ICD-10-CM | POA: Diagnosis not present

## 2023-04-01 DIAGNOSIS — Z7984 Long term (current) use of oral hypoglycemic drugs: Secondary | ICD-10-CM | POA: Diagnosis not present

## 2023-04-01 DIAGNOSIS — Z809 Family history of malignant neoplasm, unspecified: Secondary | ICD-10-CM | POA: Diagnosis not present

## 2023-04-01 DIAGNOSIS — E1142 Type 2 diabetes mellitus with diabetic polyneuropathy: Secondary | ICD-10-CM | POA: Diagnosis not present

## 2023-04-01 DIAGNOSIS — Z87891 Personal history of nicotine dependence: Secondary | ICD-10-CM | POA: Diagnosis not present

## 2023-04-02 NOTE — Progress Notes (Signed)
Remote pacemaker transmission.

## 2023-04-07 DIAGNOSIS — Z7901 Long term (current) use of anticoagulants: Secondary | ICD-10-CM | POA: Diagnosis not present

## 2023-04-14 DIAGNOSIS — Z7901 Long term (current) use of anticoagulants: Secondary | ICD-10-CM | POA: Diagnosis not present

## 2023-04-22 DIAGNOSIS — I482 Chronic atrial fibrillation, unspecified: Secondary | ICD-10-CM | POA: Diagnosis not present

## 2023-04-22 DIAGNOSIS — Z7901 Long term (current) use of anticoagulants: Secondary | ICD-10-CM | POA: Diagnosis not present

## 2023-04-23 DIAGNOSIS — I879 Disorder of vein, unspecified: Secondary | ICD-10-CM | POA: Diagnosis not present

## 2023-04-23 DIAGNOSIS — E1151 Type 2 diabetes mellitus with diabetic peripheral angiopathy without gangrene: Secondary | ICD-10-CM | POA: Diagnosis not present

## 2023-04-23 DIAGNOSIS — L602 Onychogryphosis: Secondary | ICD-10-CM | POA: Diagnosis not present

## 2023-04-23 DIAGNOSIS — B351 Tinea unguium: Secondary | ICD-10-CM | POA: Diagnosis not present

## 2023-04-23 DIAGNOSIS — L84 Corns and callosities: Secondary | ICD-10-CM | POA: Diagnosis not present

## 2023-04-24 ENCOUNTER — Ambulatory Visit: Payer: Medicare HMO

## 2023-04-24 DIAGNOSIS — I442 Atrioventricular block, complete: Secondary | ICD-10-CM

## 2023-04-26 ENCOUNTER — Encounter: Payer: Self-pay | Admitting: Internal Medicine

## 2023-04-26 LAB — CUP PACEART REMOTE DEVICE CHECK
Battery Remaining Longevity: 3 mo
Battery Remaining Percentage: 3 %
Brady Statistic RV Percent Paced: 99 %
Date Time Interrogation Session: 20250306030100
Implantable Lead Connection Status: 753985
Implantable Lead Implant Date: 20150805
Implantable Lead Location: 753860
Implantable Lead Model: 4137
Implantable Lead Serial Number: 29478499
Implantable Pulse Generator Implant Date: 20150805
Lead Channel Impedance Value: 781 Ohm
Lead Channel Pacing Threshold Amplitude: 1.5 V
Lead Channel Pacing Threshold Pulse Width: 0.4 ms
Lead Channel Setting Pacing Amplitude: 1.6 V
Lead Channel Setting Pacing Pulse Width: 0.4 ms
Lead Channel Setting Sensing Sensitivity: 2.5 mV
Pulse Gen Serial Number: 119862
Zone Setting Status: 755011

## 2023-04-29 NOTE — Addendum Note (Signed)
 Addended by: Geralyn Flash D on: 04/29/2023 01:21 PM   Modules accepted: Orders, Level of Service

## 2023-04-29 NOTE — Progress Notes (Signed)
 Remote pacemaker transmission.

## 2023-05-27 DIAGNOSIS — G4733 Obstructive sleep apnea (adult) (pediatric): Secondary | ICD-10-CM | POA: Diagnosis not present

## 2023-05-30 NOTE — Addendum Note (Signed)
 Addended by: Elease Etienne A on: 05/30/2023 10:45 AM   Modules accepted: Orders

## 2023-05-30 NOTE — Progress Notes (Signed)
 Remote pacemaker transmission.

## 2023-06-02 ENCOUNTER — Ambulatory Visit (INDEPENDENT_AMBULATORY_CARE_PROVIDER_SITE_OTHER)

## 2023-06-02 DIAGNOSIS — I442 Atrioventricular block, complete: Secondary | ICD-10-CM

## 2023-06-02 LAB — CUP PACEART REMOTE DEVICE CHECK
Battery Remaining Longevity: 3 mo
Battery Remaining Percentage: 3 %
Brady Statistic RV Percent Paced: 99 %
Date Time Interrogation Session: 20250414030100
Implantable Lead Connection Status: 753985
Implantable Lead Implant Date: 20150805
Implantable Lead Location: 753860
Implantable Lead Model: 4137
Implantable Lead Serial Number: 29478499
Implantable Pulse Generator Implant Date: 20150805
Lead Channel Impedance Value: 763 Ohm
Lead Channel Pacing Threshold Amplitude: 1.3 V
Lead Channel Pacing Threshold Pulse Width: 0.4 ms
Lead Channel Setting Pacing Amplitude: 1.7 V
Lead Channel Setting Pacing Pulse Width: 0.4 ms
Lead Channel Setting Sensing Sensitivity: 2.5 mV
Pulse Gen Serial Number: 119862
Zone Setting Status: 755011

## 2023-06-03 ENCOUNTER — Encounter: Payer: Self-pay | Admitting: Internal Medicine

## 2023-06-23 DIAGNOSIS — L219 Seborrheic dermatitis, unspecified: Secondary | ICD-10-CM | POA: Diagnosis not present

## 2023-06-23 DIAGNOSIS — C44622 Squamous cell carcinoma of skin of right upper limb, including shoulder: Secondary | ICD-10-CM | POA: Diagnosis not present

## 2023-06-23 DIAGNOSIS — D485 Neoplasm of uncertain behavior of skin: Secondary | ICD-10-CM | POA: Diagnosis not present

## 2023-06-26 ENCOUNTER — Ambulatory Visit (INDEPENDENT_AMBULATORY_CARE_PROVIDER_SITE_OTHER): Payer: Medicare HMO

## 2023-06-26 DIAGNOSIS — I442 Atrioventricular block, complete: Secondary | ICD-10-CM | POA: Diagnosis not present

## 2023-06-26 LAB — CUP PACEART REMOTE DEVICE CHECK
Battery Remaining Longevity: 3 mo
Battery Remaining Percentage: 3 %
Brady Statistic RV Percent Paced: 99 %
Date Time Interrogation Session: 20250508030100
Implantable Lead Connection Status: 753985
Implantable Lead Implant Date: 20150805
Implantable Lead Location: 753860
Implantable Lead Model: 4137
Implantable Lead Serial Number: 29478499
Implantable Pulse Generator Implant Date: 20150805
Lead Channel Impedance Value: 719 Ohm
Lead Channel Pacing Threshold Amplitude: 1.1 V
Lead Channel Pacing Threshold Pulse Width: 0.4 ms
Lead Channel Setting Pacing Amplitude: 1.6 V
Lead Channel Setting Pacing Pulse Width: 0.4 ms
Lead Channel Setting Sensing Sensitivity: 2.5 mV
Pulse Gen Serial Number: 119862
Zone Setting Status: 755011

## 2023-06-27 ENCOUNTER — Telehealth: Payer: Self-pay | Admitting: Internal Medicine

## 2023-06-27 NOTE — Telephone Encounter (Signed)
 Pt has some question about the battery life of his pacemaker.  He would like to speak to the nurse.    Best number [phone]

## 2023-06-27 NOTE — Telephone Encounter (Signed)
 Called and spoke with patient. The device's next threshold after 3 months is explant indicated. He was happy for the call back.

## 2023-06-30 ENCOUNTER — Encounter: Payer: Self-pay | Admitting: Internal Medicine

## 2023-06-30 DIAGNOSIS — C44622 Squamous cell carcinoma of skin of right upper limb, including shoulder: Secondary | ICD-10-CM | POA: Diagnosis not present

## 2023-07-02 DIAGNOSIS — E1151 Type 2 diabetes mellitus with diabetic peripheral angiopathy without gangrene: Secondary | ICD-10-CM | POA: Diagnosis not present

## 2023-07-02 DIAGNOSIS — B351 Tinea unguium: Secondary | ICD-10-CM | POA: Diagnosis not present

## 2023-07-02 DIAGNOSIS — L602 Onychogryphosis: Secondary | ICD-10-CM | POA: Diagnosis not present

## 2023-07-02 DIAGNOSIS — I879 Disorder of vein, unspecified: Secondary | ICD-10-CM | POA: Diagnosis not present

## 2023-07-02 DIAGNOSIS — L84 Corns and callosities: Secondary | ICD-10-CM | POA: Diagnosis not present

## 2023-07-17 NOTE — Progress Notes (Signed)
 Remote pacemaker transmission.

## 2023-07-28 ENCOUNTER — Ambulatory Visit (INDEPENDENT_AMBULATORY_CARE_PROVIDER_SITE_OTHER)

## 2023-07-28 DIAGNOSIS — I442 Atrioventricular block, complete: Secondary | ICD-10-CM

## 2023-07-29 ENCOUNTER — Telehealth: Payer: Self-pay | Admitting: Internal Medicine

## 2023-07-29 LAB — CUP PACEART REMOTE DEVICE CHECK
Battery Remaining Longevity: 3 mo — CL
Battery Remaining Percentage: 1 %
Brady Statistic RV Percent Paced: 99 %
Date Time Interrogation Session: 20250609075200
Implantable Lead Connection Status: 753985
Implantable Lead Implant Date: 20150805
Implantable Lead Location: 753860
Implantable Lead Model: 4137
Implantable Lead Serial Number: 29478499
Implantable Pulse Generator Implant Date: 20150805
Lead Channel Impedance Value: 788 Ohm
Lead Channel Pacing Threshold Amplitude: 1.4 V
Lead Channel Pacing Threshold Pulse Width: 0.4 ms
Lead Channel Setting Pacing Amplitude: 1.7 V
Lead Channel Setting Pacing Pulse Width: 0.4 ms
Lead Channel Setting Sensing Sensitivity: 2.5 mV
Pulse Gen Serial Number: 119862
Zone Setting Status: 755011

## 2023-07-29 NOTE — Telephone Encounter (Signed)
 I spoke with the pt to let him know that he has 3 months left on his battery. The pt verbalized understanding.

## 2023-07-29 NOTE — Telephone Encounter (Signed)
 LMTCB

## 2023-07-29 NOTE — Telephone Encounter (Signed)
 Patient would like to speak with a device team in regard to his report from yesterday. Patient would like to know if his battery is set to end soon. Please advise.

## 2023-08-01 ENCOUNTER — Ambulatory Visit: Payer: Self-pay | Admitting: Internal Medicine

## 2023-08-01 NOTE — Progress Notes (Signed)
 Remote pacemaker transmission.

## 2023-08-26 ENCOUNTER — Telehealth: Payer: Self-pay | Admitting: *Deleted

## 2023-08-26 LAB — CUP PACEART REMOTE DEVICE CHECK
Brady Statistic RV Percent Paced: 99 %
Date Time Interrogation Session: 20250708030100
Implantable Lead Connection Status: 753985
Implantable Lead Implant Date: 20150805
Implantable Lead Location: 753860
Implantable Lead Model: 4137
Implantable Lead Serial Number: 29478499
Implantable Pulse Generator Implant Date: 20150805
Lead Channel Impedance Value: 760 Ohm
Lead Channel Pacing Threshold Amplitude: 1.4 V
Lead Channel Pacing Threshold Pulse Width: 0.4 ms
Lead Channel Setting Pacing Amplitude: 1.9 V
Lead Channel Setting Pacing Pulse Width: 0.4 ms
Lead Channel Setting Sensing Sensitivity: 2.5 mV
Pulse Gen Serial Number: 119862
Zone Setting Status: 755011

## 2023-08-26 NOTE — Telephone Encounter (Signed)
 Device alert received from [employer]:  PPM Scheduled remote reviewed. Normal device function.  Presenting rhythm:  VP ERI reached 08/25/23 - Route to triage Next remote 91 days. LA, CVRS _______________________________________________________________________  Scheduled remote routed to MD   Called patient to notify them of their device reaching ERI and need to come in to discuss generator change out with provider   This RN scheduled patient for office visit with Daphne Barrack, NP on 08/28/23   Patient given information about new building location and device clinic number to call with any questions  All questions answered, and patient appreciative of call

## 2023-08-27 DIAGNOSIS — E119 Type 2 diabetes mellitus without complications: Secondary | ICD-10-CM | POA: Diagnosis not present

## 2023-08-27 DIAGNOSIS — H52203 Unspecified astigmatism, bilateral: Secondary | ICD-10-CM | POA: Diagnosis not present

## 2023-08-27 DIAGNOSIS — H02831 Dermatochalasis of right upper eyelid: Secondary | ICD-10-CM | POA: Diagnosis not present

## 2023-08-27 DIAGNOSIS — H5203 Hypermetropia, bilateral: Secondary | ICD-10-CM | POA: Diagnosis not present

## 2023-08-27 DIAGNOSIS — H02834 Dermatochalasis of left upper eyelid: Secondary | ICD-10-CM | POA: Diagnosis not present

## 2023-08-27 DIAGNOSIS — H401131 Primary open-angle glaucoma, bilateral, mild stage: Secondary | ICD-10-CM | POA: Diagnosis not present

## 2023-08-27 DIAGNOSIS — Z7984 Long term (current) use of oral hypoglycemic drugs: Secondary | ICD-10-CM | POA: Diagnosis not present

## 2023-08-27 DIAGNOSIS — H43813 Vitreous degeneration, bilateral: Secondary | ICD-10-CM | POA: Diagnosis not present

## 2023-08-27 DIAGNOSIS — H524 Presbyopia: Secondary | ICD-10-CM | POA: Diagnosis not present

## 2023-08-28 ENCOUNTER — Encounter: Payer: Self-pay | Admitting: Pulmonary Disease

## 2023-08-28 ENCOUNTER — Encounter

## 2023-08-28 ENCOUNTER — Ambulatory Visit: Attending: Pulmonary Disease | Admitting: Pulmonary Disease

## 2023-08-28 ENCOUNTER — Ambulatory Visit: Payer: Self-pay | Admitting: Internal Medicine

## 2023-08-28 VITALS — BP 114/68 | HR 60 | Ht 73.0 in | Wt 212.5 lb

## 2023-08-28 DIAGNOSIS — I4891 Unspecified atrial fibrillation: Secondary | ICD-10-CM

## 2023-08-28 DIAGNOSIS — D6869 Other thrombophilia: Secondary | ICD-10-CM

## 2023-08-28 DIAGNOSIS — Z950 Presence of cardiac pacemaker: Secondary | ICD-10-CM

## 2023-08-28 DIAGNOSIS — I442 Atrioventricular block, complete: Secondary | ICD-10-CM | POA: Diagnosis not present

## 2023-08-28 LAB — CUP PACEART INCLINIC DEVICE CHECK
Date Time Interrogation Session: 20250710175336
Implantable Lead Connection Status: 753985
Implantable Lead Implant Date: 20150805
Implantable Lead Location: 753860
Implantable Lead Model: 4137
Implantable Lead Serial Number: 29478499
Implantable Pulse Generator Implant Date: 20150805
Lead Channel Impedance Value: 756 Ohm
Lead Channel Pacing Threshold Amplitude: 1.4 V
Lead Channel Pacing Threshold Pulse Width: 0.4 ms
Lead Channel Setting Pacing Amplitude: 2.1 V
Lead Channel Setting Pacing Pulse Width: 0.4 ms
Lead Channel Setting Sensing Sensitivity: 2.5 mV
Pulse Gen Serial Number: 119862
Zone Setting Status: 755011

## 2023-08-28 NOTE — Patient Instructions (Signed)
 Medication Instructions:  Your physician recommends that you continue on your current medications as directed. Please refer to the Current Medication list given to you today.  *If you need a refill on your cardiac medications before your next appointment, please call your pharmacy*  Lab Work: BMET, CBC-TODAY If you have labs (blood work) drawn today and your tests are completely normal, you will receive your results only by: MyChart Message (if you have MyChart) OR A paper copy in the mail If you have any lab test that is abnormal or we need to change your treatment, we will call you to review the results.  Testing/Procedures: See letter  Follow-Up: At [facility], you and your health needs are our priority.  As part of our continuing mission to provide you with exceptional heart care, our providers are all part of one team.  This team includes your primary Cardiologist (physician) and Advanced Practice Providers or APPs (Physician Assistants and Nurse Practitioners) who all work together to provide you with the care you need, when you need it.  Your next appointment:   Follow up will be arranged for you and print out on your discharge summary after your procedure.

## 2023-08-28 NOTE — Progress Notes (Signed)
  Electrophysiology Office Note:   Date:  08/28/2023  ID:  Patrick Lozano, DOB 1940-09-19, MRN [id number]  Primary Cardiologist: Candyce Reek, MD Primary Heart Failure: None Electrophysiologist: Danelle Birmingham, MD      History of Present Illness:   Patrick Lozano is a 83 y.o. male with h/o CHB s/p PPM, AF, HTN, HLD, obesity, DM II seen today for routine electrophysiology followup.   Since last being seen in our clinic the patient reports he has been doing well.  He has a tree farm continues to remain active.    He denies chest pain, palpitations, dyspnea, PND, orthopnea, nausea, vomiting, dizziness, syncope, edema, weight gain, or early satiety.   Review of systems complete and found to be negative unless listed in HPI.    EP Information / Studies Reviewed:    EKG is not ordered today. EKG from 01/21/23 reviewed which showed VP 60 bpm      PPM Interrogation-  reviewed in detail today,  See PACEART report.  Device History: Magazine features editor PPM implanted 09/22/13 for Sinus Node Dysfunction / CHB  Risk Assessment/Calculations:    CHA2DS2-VASc Score = 4   This indicates a 4.8% annual risk of stroke. The patient's score is based upon: CHF History: 0 HTN History: 1 Diabetes History: 1 Stroke History: 0 Vascular Disease History: 0 Age Score: 2 Gender Score: 0             Physical Exam:   VS:  BP 114/68   Pulse 60   Ht 6' 1 (1.854 m)   Wt 212 lb 8 oz (96.4 kg)   SpO2 95%   BMI 28.04 kg/m    Wt Readings from Last 3 Encounters:  08/28/23 212 lb 8 oz (96.4 kg)  01/21/23 228 lb (103.4 kg)  12/25/21 235 lb (106.6 kg)     GEN: Well nourished, well developed in no acute distress NECK: No JVD; No carotid bruits CARDIAC: Regular rate and rhythm, SEM, no rubs, gallops RESPIRATORY:  Clear to auscultation without rales, wheezing or rhonchi  ABDOMEN: Soft, non-tender, non-distended EXTREMITIES:  No edema; No deformity   ASSESSMENT AND PLAN:    SND /  CHB s/p [employer] PPM  -Normal PPM function -See Elisabeth Art report -No changes today -At ERI as of 08/25/23, plan for generator change with Dr. Birmingham. Procedure details discussed with patient, written instructions provided -pre-procedure labs > BMP, CBC  Permanent Atrial Fibrillation  CHA2DS2-VASc 4 -OAC for stroke prophylaxis  -continue verapamil   Secondary Hypercoagulable State  -continue Eliquis 5mg  BID, dose appropriate by wt / follow pending Cr  -may need to dose reduce pending Cr    Disposition:   Follow up with Dr. Birmingham as planned for generator change   Signed, Daphne Barrack, NP-C, AGACNP-BC [facility] - Electrophysiology  08/28/2023, 5:52 PM

## 2023-08-29 ENCOUNTER — Ambulatory Visit: Payer: Self-pay | Admitting: Pulmonary Disease

## 2023-08-29 LAB — CBC
Hematocrit: 42.5 % (ref 37.5–51.0)
Hemoglobin: 14.3 g/dL (ref 13.0–17.7)
MCH: 31.4 pg (ref 26.6–33.0)
MCHC: 33.6 g/dL (ref 31.5–35.7)
MCV: 93 fL (ref 79–97)
Platelets: 258 x10E3/uL (ref 150–450)
RBC: 4.56 x10E6/uL (ref 4.14–5.80)
RDW: 13 % (ref 11.6–15.4)
WBC: 7 x10E3/uL (ref 3.4–10.8)

## 2023-08-29 LAB — BASIC METABOLIC PANEL WITH GFR
BUN/Creatinine Ratio: 20 (ref 10–24)
BUN: 15 mg/dL (ref 8–27)
CO2: 22 mmol/L (ref 20–29)
Calcium: 9.9 mg/dL (ref 8.6–10.2)
Chloride: 100 mmol/L (ref 96–106)
Creatinine, Ser: 0.76 mg/dL (ref 0.76–1.27)
Glucose: 143 mg/dL — ABNORMAL HIGH (ref 70–99)
Potassium: 4.5 mmol/L (ref 3.5–5.2)
Sodium: 138 mmol/L (ref 134–144)
eGFR: 89 mL/min/1.73 (ref >59)

## 2023-09-09 ENCOUNTER — Telehealth (HOSPITAL_COMMUNITY): Payer: Self-pay

## 2023-09-09 ENCOUNTER — Encounter: Payer: Self-pay | Admitting: Emergency Medicine

## 2023-09-09 NOTE — Telephone Encounter (Signed)
 Attempted to reach patient to discuss upcoming procedure, no answer. Left VM for patient to return call.

## 2023-09-09 NOTE — Addendum Note (Signed)
 Addended by: TAWNI DRILLING D on: 09/09/2023 04:56 PM   Modules accepted: Orders, Level of Service

## 2023-09-09 NOTE — Progress Notes (Signed)
 Remote pacemaker transmission.

## 2023-09-10 ENCOUNTER — Telehealth: Payer: Self-pay | Admitting: Internal Medicine

## 2023-09-10 DIAGNOSIS — L97512 Non-pressure chronic ulcer of other part of right foot with fat layer exposed: Secondary | ICD-10-CM | POA: Diagnosis not present

## 2023-09-10 DIAGNOSIS — L84 Corns and callosities: Secondary | ICD-10-CM | POA: Diagnosis not present

## 2023-09-10 DIAGNOSIS — M19072 Primary osteoarthritis, left ankle and foot: Secondary | ICD-10-CM | POA: Diagnosis not present

## 2023-09-10 DIAGNOSIS — M19071 Primary osteoarthritis, right ankle and foot: Secondary | ICD-10-CM | POA: Diagnosis not present

## 2023-09-10 DIAGNOSIS — I879 Disorder of vein, unspecified: Secondary | ICD-10-CM | POA: Diagnosis not present

## 2023-09-10 DIAGNOSIS — B351 Tinea unguium: Secondary | ICD-10-CM | POA: Diagnosis not present

## 2023-09-10 DIAGNOSIS — M2061 Acquired deformities of toe(s), unspecified, right foot: Secondary | ICD-10-CM | POA: Diagnosis not present

## 2023-09-10 DIAGNOSIS — L03031 Cellulitis of right toe: Secondary | ICD-10-CM | POA: Diagnosis not present

## 2023-09-10 DIAGNOSIS — E1151 Type 2 diabetes mellitus with diabetic peripheral angiopathy without gangrene: Secondary | ICD-10-CM | POA: Diagnosis not present

## 2023-09-10 DIAGNOSIS — L602 Onychogryphosis: Secondary | ICD-10-CM | POA: Diagnosis not present

## 2023-09-10 NOTE — Telephone Encounter (Signed)
 Pt called in stating he went to podiatrist today and has an infection in his toe. He asked if he needs to r/s procedure 09/15/23

## 2023-09-10 NOTE — Telephone Encounter (Signed)
 Left message for pt to call.

## 2023-09-11 NOTE — Telephone Encounter (Signed)
 After speaking with Dr. Waddell, I called pt and informed him that due to the infection it would be best to postpone his procedure until he is finished with his antibiotic.   His PPM Gen Change has been moved from 7/28 to 8/7 at 9:30 am. I went over updated instructions with pt and mailed his instruction letter to him.

## 2023-09-12 ENCOUNTER — Telehealth: Payer: Self-pay

## 2023-09-12 NOTE — Telephone Encounter (Signed)
 Pt left a voicemail stating he need a call from [facility] or Dr. Waddell.

## 2023-09-17 ENCOUNTER — Telehealth: Payer: Self-pay

## 2023-09-17 ENCOUNTER — Other Ambulatory Visit: Payer: Self-pay | Admitting: Podiatry

## 2023-09-17 ENCOUNTER — Telehealth (HOSPITAL_COMMUNITY): Payer: Self-pay

## 2023-09-17 DIAGNOSIS — I739 Peripheral vascular disease, unspecified: Secondary | ICD-10-CM | POA: Diagnosis not present

## 2023-09-17 DIAGNOSIS — I879 Disorder of vein, unspecified: Secondary | ICD-10-CM | POA: Diagnosis not present

## 2023-09-17 DIAGNOSIS — I442 Atrioventricular block, complete: Secondary | ICD-10-CM

## 2023-09-17 DIAGNOSIS — L602 Onychogryphosis: Secondary | ICD-10-CM | POA: Diagnosis not present

## 2023-09-17 DIAGNOSIS — E1151 Type 2 diabetes mellitus with diabetic peripheral angiopathy without gangrene: Secondary | ICD-10-CM | POA: Diagnosis not present

## 2023-09-17 DIAGNOSIS — L03031 Cellulitis of right toe: Secondary | ICD-10-CM | POA: Diagnosis not present

## 2023-09-17 DIAGNOSIS — B351 Tinea unguium: Secondary | ICD-10-CM | POA: Diagnosis not present

## 2023-09-17 DIAGNOSIS — L84 Corns and callosities: Secondary | ICD-10-CM | POA: Diagnosis not present

## 2023-09-17 DIAGNOSIS — L089 Local infection of the skin and subcutaneous tissue, unspecified: Secondary | ICD-10-CM

## 2023-09-17 DIAGNOSIS — L97512 Non-pressure chronic ulcer of other part of right foot with fat layer exposed: Secondary | ICD-10-CM | POA: Diagnosis not present

## 2023-09-17 DIAGNOSIS — M2061 Acquired deformities of toe(s), unspecified, right foot: Secondary | ICD-10-CM | POA: Diagnosis not present

## 2023-09-17 DIAGNOSIS — M19071 Primary osteoarthritis, right ankle and foot: Secondary | ICD-10-CM | POA: Diagnosis not present

## 2023-09-17 DIAGNOSIS — M19072 Primary osteoarthritis, left ankle and foot: Secondary | ICD-10-CM | POA: Diagnosis not present

## 2023-09-17 NOTE — Telephone Encounter (Signed)
 Spoke with patient to discuss upcoming procedure. Patient states he is currently on the other line with April, EP scheduler who advised Dr. Waddell has a conflict in schedule and procedure date will be moved to August 13. Advised patient that I will touch base him on next week. He voiced understanding.

## 2023-09-17 NOTE — Telephone Encounter (Signed)
 Called pt and moved his PPM Gen change with Dr. Waddell from 8/7 to 8/13 at 9:30 AM.  (Due to the lab being over booked with EP MD's)  He will need updated labs because of this. He will come to [facility] on 8/8 to have that done.   I will send him updated instruction letter via MyChart per his request.

## 2023-09-18 ENCOUNTER — Other Ambulatory Visit (HOSPITAL_COMMUNITY): Payer: Self-pay | Admitting: Podiatry

## 2023-09-18 DIAGNOSIS — L089 Local infection of the skin and subcutaneous tissue, unspecified: Secondary | ICD-10-CM

## 2023-09-22 ENCOUNTER — Other Ambulatory Visit (HOSPITAL_COMMUNITY): Payer: Self-pay | Admitting: Podiatry

## 2023-09-22 DIAGNOSIS — L602 Onychogryphosis: Secondary | ICD-10-CM

## 2023-09-22 DIAGNOSIS — I739 Peripheral vascular disease, unspecified: Secondary | ICD-10-CM

## 2023-09-22 DIAGNOSIS — B351 Tinea unguium: Secondary | ICD-10-CM

## 2023-09-22 DIAGNOSIS — L97512 Non-pressure chronic ulcer of other part of right foot with fat layer exposed: Secondary | ICD-10-CM

## 2023-09-24 ENCOUNTER — Telehealth: Payer: Self-pay | Admitting: Internal Medicine

## 2023-09-24 NOTE — Telephone Encounter (Signed)
 Patient's device is not MRI conditional.   This is very complex.   Patient is device dependent (dual chamber PPM) scheduled for gen change on 10/01/23.   I spoke with patient. He sees Dr. Glendale Many - [phone]. Who suspects infection in the bone of his toe and would like to get an MRI for precise planning of an amputation.    Will need to discuss further with Dr. Waddell regarding procedure and if there are special locations that will still perform the MRI as it is not conditional.   Message has been prior routed to provider and nurse - will wait for their response.

## 2023-09-24 NOTE — Telephone Encounter (Signed)
 I spoke with patient.  He has been diabetic for many years and was recently treated for an infection in his toe.  He had X rays done and it looks like he has an infection in the bone in his toe.  May need to have surgery.  He is asking  if pacemaker procedure date will need to be changed because of this. His doctor would like to do an MRI if possible. Will forward to Dr Waddell regarding timing of pacemaker procedure and to device clinic to see if his pacemaker is MRI compatible

## 2023-09-24 NOTE — Telephone Encounter (Signed)
 Patient is calling to speak with the nurse about a complication that has come up. Please advise

## 2023-09-25 NOTE — Telephone Encounter (Signed)
 Returned call to Pt.  Advised his device was NOT MRI compatible.  Pt concerned about gen change with possible toe infection.  He states he is on antibiotics.  Discussed with Dr. Waddell.  Ok for gen change.  Pt is dependent.

## 2023-09-25 NOTE — Telephone Encounter (Signed)
 His device is not MRI compatible having been placed over 10 years ago. Specifically his pacemaker lead is not compatible. GT

## 2023-09-26 DIAGNOSIS — I442 Atrioventricular block, complete: Secondary | ICD-10-CM | POA: Diagnosis not present

## 2023-09-27 LAB — CBC
Hematocrit: 41.5 % (ref 37.5–51.0)
Hemoglobin: 13.7 g/dL (ref 13.0–17.7)
MCH: 30.9 pg (ref 26.6–33.0)
MCHC: 33 g/dL (ref 31.5–35.7)
MCV: 94 fL (ref 79–97)
Platelets: 277 x10E3/uL (ref 150–450)
RBC: 4.44 x10E6/uL (ref 4.14–5.80)
RDW: 13 % (ref 11.6–15.4)
WBC: 5.7 x10E3/uL (ref 3.4–10.8)

## 2023-09-27 LAB — BASIC METABOLIC PANEL WITH GFR
BUN/Creatinine Ratio: 27 — ABNORMAL HIGH (ref 10–24)
BUN: 20 mg/dL (ref 8–27)
CO2: 21 mmol/L (ref 20–29)
Calcium: 9.9 mg/dL (ref 8.6–10.2)
Chloride: 101 mmol/L (ref 96–106)
Creatinine, Ser: 0.73 mg/dL — ABNORMAL LOW (ref 0.76–1.27)
Glucose: 178 mg/dL — ABNORMAL HIGH (ref 70–99)
Potassium: 4.6 mmol/L (ref 3.5–5.2)
Sodium: 139 mmol/L (ref 134–144)
eGFR: 90 mL/min/1.73 (ref >59)

## 2023-09-29 ENCOUNTER — Encounter

## 2023-09-29 NOTE — Telephone Encounter (Signed)
 Spoke with patient to discuss upcoming procedure.   Confirmed patient is scheduled for a PPM generator change on Wednesday, August 13 with Dr. Danelle Birmingham. Instructed patient to arrive at the Main Entrance A at [facility]: [address] [place], [place] [address] and check in at Admitting at 8:30 AM.   Labs completed  Any recent signs of acute illness or been started on antibiotics? Reports he has been treated for an Infection of 3rd toe on right foot- will complete Doxyclycline on 8/13. Reports area has healed but doctor still wants to get a MRI. Dr. Birmingham is aware and OK to proceed with gen change.  Any medications to hold? Hold Jardiance for 3 days prior to the procedure. Last dose on August 09.  Hold Eliquis for 2 days prior to your procedure. Your last dose will be August 10. Medication instructions:  On the morning of your procedure you may take morning medications with a sip of water.  No eating or drinking after midnight prior to procedure.   The night before your procedure and the morning of your procedure, wash thoroughly with the CHG surgical soap from the neck down, paying special attention to the area where your procedure will be performed.  Advised of plan to go home the same day and will only stay overnight if medically necessary. You MUST have a responsible adult to drive you home and MUST be with you the first 24 hours after you arrive home.  Patient verbalized understanding to all instructions provided and agreed to proceed with procedure.

## 2023-09-30 ENCOUNTER — Ambulatory Visit

## 2023-09-30 DIAGNOSIS — L602 Onychogryphosis: Secondary | ICD-10-CM | POA: Diagnosis not present

## 2023-09-30 DIAGNOSIS — I739 Peripheral vascular disease, unspecified: Secondary | ICD-10-CM | POA: Diagnosis not present

## 2023-09-30 DIAGNOSIS — M19071 Primary osteoarthritis, right ankle and foot: Secondary | ICD-10-CM | POA: Diagnosis not present

## 2023-09-30 DIAGNOSIS — M19072 Primary osteoarthritis, left ankle and foot: Secondary | ICD-10-CM | POA: Diagnosis not present

## 2023-09-30 DIAGNOSIS — I879 Disorder of vein, unspecified: Secondary | ICD-10-CM | POA: Diagnosis not present

## 2023-09-30 DIAGNOSIS — L84 Corns and callosities: Secondary | ICD-10-CM | POA: Diagnosis not present

## 2023-09-30 DIAGNOSIS — L97512 Non-pressure chronic ulcer of other part of right foot with fat layer exposed: Secondary | ICD-10-CM | POA: Diagnosis not present

## 2023-09-30 DIAGNOSIS — E1151 Type 2 diabetes mellitus with diabetic peripheral angiopathy without gangrene: Secondary | ICD-10-CM | POA: Diagnosis not present

## 2023-09-30 DIAGNOSIS — L03031 Cellulitis of right toe: Secondary | ICD-10-CM | POA: Diagnosis not present

## 2023-09-30 DIAGNOSIS — B351 Tinea unguium: Secondary | ICD-10-CM | POA: Diagnosis not present

## 2023-09-30 DIAGNOSIS — M2061 Acquired deformities of toe(s), unspecified, right foot: Secondary | ICD-10-CM | POA: Diagnosis not present

## 2023-10-01 ENCOUNTER — Other Ambulatory Visit: Payer: Self-pay

## 2023-10-01 ENCOUNTER — Encounter (HOSPITAL_COMMUNITY): Payer: Self-pay | Admitting: Internal Medicine

## 2023-10-01 ENCOUNTER — Ambulatory Visit (HOSPITAL_COMMUNITY)
Admission: RE | Admit: 2023-10-01 | Discharge: 2023-10-01 | Disposition: A | Discharge status: Home / Self Care | Attending: Internal Medicine | Admitting: Internal Medicine

## 2023-10-01 ENCOUNTER — Encounter (HOSPITAL_COMMUNITY)
Admission: RE | Disposition: A | Discharge status: Home / Self Care | Payer: Self-pay | Source: Home / Self Care | Attending: Internal Medicine

## 2023-10-01 DIAGNOSIS — Z79899 Other long term (current) drug therapy: Secondary | ICD-10-CM | POA: Diagnosis not present

## 2023-10-01 DIAGNOSIS — Z45010 Encounter for checking and testing of cardiac pacemaker pulse generator [battery]: Secondary | ICD-10-CM

## 2023-10-01 DIAGNOSIS — D6869 Other thrombophilia: Secondary | ICD-10-CM | POA: Diagnosis not present

## 2023-10-01 DIAGNOSIS — E669 Obesity, unspecified: Secondary | ICD-10-CM | POA: Insufficient documentation

## 2023-10-01 DIAGNOSIS — E785 Hyperlipidemia, unspecified: Secondary | ICD-10-CM | POA: Diagnosis not present

## 2023-10-01 DIAGNOSIS — I442 Atrioventricular block, complete: Secondary | ICD-10-CM

## 2023-10-01 DIAGNOSIS — E119 Type 2 diabetes mellitus without complications: Secondary | ICD-10-CM | POA: Insufficient documentation

## 2023-10-01 DIAGNOSIS — Z7901 Long term (current) use of anticoagulants: Secondary | ICD-10-CM | POA: Diagnosis not present

## 2023-10-01 DIAGNOSIS — Z6828 Body mass index (BMI) 28.0-28.9, adult: Secondary | ICD-10-CM | POA: Insufficient documentation

## 2023-10-01 DIAGNOSIS — I4821 Permanent atrial fibrillation: Secondary | ICD-10-CM | POA: Diagnosis not present

## 2023-10-01 DIAGNOSIS — I10 Essential (primary) hypertension: Secondary | ICD-10-CM | POA: Insufficient documentation

## 2023-10-01 HISTORY — PX: PPM GENERATOR CHANGEOUT: EP1233

## 2023-10-01 LAB — GLUCOSE, CAPILLARY
Glucose-Capillary: 152 mg/dL — ABNORMAL HIGH (ref 70–99)
Glucose-Capillary: 128 mg/dL — ABNORMAL HIGH (ref 70–99)

## 2023-10-01 SURGERY — PPM GENERATOR CHANGEOUT
Anesthesia: LOCAL

## 2023-10-01 MED ORDER — FENTANYL CITRATE (PF) 100 MCG/2ML IJ SOLN
INTRAMUSCULAR | Status: AC
  Filled 2023-10-01: qty 2

## 2023-10-01 MED ORDER — SODIUM CHLORIDE 0.9 % IV SOLN
INTRAVENOUS | Status: AC
  Filled 2023-10-01: qty 2

## 2023-10-01 MED ORDER — ACETAMINOPHEN 325 MG PO TABS: 325.0000 mg | ORAL_TABLET | ORAL | Status: DC | PRN

## 2023-10-01 MED ORDER — LIDOCAINE HCL (PF) 1 % IJ SOLN
INTRAMUSCULAR | Status: DC | PRN
  Administered 2023-10-01 (×2): 60 mL

## 2023-10-01 MED ORDER — LIDOCAINE HCL (PF) 1 % IJ SOLN
INTRAMUSCULAR | Status: AC
  Filled 2023-10-01: qty 60

## 2023-10-01 MED ORDER — SODIUM CHLORIDE 0.9 % IV SOLN: INTRAVENOUS | Status: DC

## 2023-10-01 MED ORDER — CEFAZOLIN SODIUM-DEXTROSE 2-4 GM/100ML-% IV SOLN
INTRAVENOUS | Status: AC
  Filled 2023-10-01: qty 100

## 2023-10-01 MED ORDER — MIDAZOLAM HCL 2 MG/2ML IJ SOLN
INTRAMUSCULAR | Status: AC
  Filled 2023-10-01: qty 2

## 2023-10-01 MED ORDER — CEFAZOLIN SODIUM-DEXTROSE 2-4 GM/100ML-% IV SOLN
2.0000 g | INTRAVENOUS | Status: AC
  Administered 2023-10-01 (×2): 2 g via INTRAVENOUS

## 2023-10-01 MED ORDER — CHLORHEXIDINE GLUCONATE 4 % EX SOLN: 4.0000 | Freq: Once | CUTANEOUS | Status: DC

## 2023-10-01 MED ORDER — ONDANSETRON HCL 4 MG/2ML IJ SOLN: 4.0000 mg | Freq: Four times a day (QID) | INTRAMUSCULAR | Status: DC | PRN

## 2023-10-01 MED ORDER — POVIDONE-IODINE 10 % EX SWAB: 2.0000 | Freq: Once | CUTANEOUS | Status: DC

## 2023-10-01 MED ORDER — SODIUM CHLORIDE 0.9 % IV SOLN
80.0000 mg | INTRAVENOUS | Status: AC
  Administered 2023-10-01 (×2): 80 mg

## 2023-10-01 SURGICAL SUPPLY — 4 items
CABLE SURGICAL S-101-97-12 (CABLE) ×1 IMPLANT
PACEMAKER ACCOLADE SR (Pacemaker) IMPLANT
PAD DEFIB RADIO PHYSIO CONN (PAD) ×1 IMPLANT
TRAY PACEMAKER INSERTION (PACKS) ×1 IMPLANT

## 2023-10-01 NOTE — H&P (Signed)
  Electrophysiology Office Note:   Date:  08/28/2023  ID:  NYCHOLAS RAYNER, DOB 19-Feb-1941, MRN [id number]   Primary Cardiologist: Candyce Reek, MD Primary Heart Failure: None Electrophysiologist: Danelle Birmingham, MD       History of Present Illness:   Patrick Lozano is a 83 y.o. male with h/o CHB s/p PPM, AF, HTN, HLD, obesity, DM II seen today for routine electrophysiology followup.    Since last being seen in our clinic the patient reports he has been doing well.  He has a tree farm continues to remain active.     He denies chest pain, palpitations, dyspnea, PND, orthopnea, nausea, vomiting, dizziness, syncope, edema, weight gain, or early satiety.    Review of systems complete and found to be negative unless listed in HPI.      EP Information / Studies Reviewed:     EKG is not ordered today. EKG from 01/21/23 reviewed which showed VP 60 bpm        PPM Interrogation-  reviewed in detail today,  See PACEART report.   Device History: Magazine features editor PPM implanted 09/22/13 for Sinus Node Dysfunction / CHB   Risk Assessment/Calculations:     CHA2DS2-VASc Score = 4   This indicates a 4.8% annual risk of stroke. The patient's score is based upon: CHF History: 0 HTN History: 1 Diabetes History: 1 Stroke History: 0 Vascular Disease History: 0 Age Score: 2 Gender Score: 0               Physical Exam:   VS:  BP 114/68   Pulse 60   Ht 6' 1 (1.854 m)   Wt 212 lb 8 oz (96.4 kg)   SpO2 95%   BMI 28.04 kg/m       Wt Readings from Last 3 Encounters:  08/28/23 212 lb 8 oz (96.4 kg)  01/21/23 228 lb (103.4 kg)  12/25/21 235 lb (106.6 kg)      GEN: Well nourished, well developed in no acute distress NECK: No JVD; No carotid bruits CARDIAC: Regular rate and rhythm, SEM, no rubs, gallops RESPIRATORY:  Clear to auscultation without rales, wheezing or rhonchi  ABDOMEN: Soft, non-tender, non-distended EXTREMITIES:  No edema; No deformity    ASSESSMENT  AND PLAN:     SND / CHB s/p [employer] PPM  -Normal PPM function -See Elisabeth Art report -No changes today -At ERI as of 08/25/23, plan for generator change with Dr. Birmingham. Procedure details discussed with patient, written instructions provided -pre-procedure labs > BMP, CBC   Permanent Atrial Fibrillation  CHA2DS2-VASc 4 -OAC for stroke prophylaxis  -continue verapamil    Secondary Hypercoagulable State  -continue Eliquis 5mg  BID, dose appropriate by wt / follow pending Cr  -may need to dose reduce pending Cr     Disposition:   Follow up with Dr. Birmingham as planned for generator change    Signed, Daphne Barrack, NP-C, AGACNP-BC [facility] - Electrophysiology  08/28/2023, 5:52 PM     EP Attending  Patient seen and examined. Agree with the findings as noted above. The patient is stable but has developed ERI on his PPM. He is dependent. He will undergo PPM gen change out. I have discussed the indications/risks/benefits/goals/expectations of the procedure and he wishes to proceed.  Danelle Payal Stanforth,MD

## 2023-10-01 NOTE — Discharge Instructions (Signed)

## 2023-10-03 ENCOUNTER — Telehealth: Payer: Self-pay

## 2023-10-03 NOTE — Telephone Encounter (Signed)
 Follow-up after same day discharge: Implant date: 10/01/2023 MD: Danelle Birmingham, MD Device: PPM BSX Location: L chest   Wound check visit: 10/07/2023 90 day MD follow-up: 12/31/2023  Remote Transmission received:yes  Dressing/sling removed: yes  Confirm OAC restart on: n/a  Please continue to monitor your cardiac device site for redness, swelling, and drainage. Call the device clinic at [phone] if you experience these symptoms, fever/chills, or have questions about your device.   Remote monitoring is used to monitor your cardiac device from home. This monitoring is scheduled every 91 days by our office. It allows us  to keep an eye on the functioning of your device to ensure it is working properly.

## 2023-10-07 ENCOUNTER — Ambulatory Visit: Attending: Cardiology

## 2023-10-07 DIAGNOSIS — I442 Atrioventricular block, complete: Secondary | ICD-10-CM

## 2023-10-07 LAB — CUP PACEART INCLINIC DEVICE CHECK
Date Time Interrogation Session: 20250819121305
Implantable Lead Connection Status: 753985
Implantable Lead Implant Date: 20150805
Implantable Lead Location: 753860
Implantable Lead Model: 4137
Implantable Lead Serial Number: 29478499
Implantable Pulse Generator Implant Date: 20250813
Lead Channel Impedance Value: 805 Ohm
Lead Channel Setting Pacing Amplitude: 3 V
Lead Channel Setting Pacing Pulse Width: 0.4 ms
Lead Channel Setting Sensing Sensitivity: 3 mV
Pulse Gen Serial Number: 943511
Zone Setting Status: 755011

## 2023-10-07 NOTE — Patient Instructions (Signed)

## 2023-10-07 NOTE — Progress Notes (Signed)
 Normal single chamber pacemaker wound check. Presenting rhythm: VP 60 . Wound well healed. Routine testing performed. Thresholds, sensing, and impedances consistent with implant measurements. No episodes. Pt enrolled in remote follow-up.

## 2023-10-14 DIAGNOSIS — I879 Disorder of vein, unspecified: Secondary | ICD-10-CM | POA: Diagnosis not present

## 2023-10-14 DIAGNOSIS — L97512 Non-pressure chronic ulcer of other part of right foot with fat layer exposed: Secondary | ICD-10-CM | POA: Diagnosis not present

## 2023-10-14 DIAGNOSIS — L03031 Cellulitis of right toe: Secondary | ICD-10-CM | POA: Diagnosis not present

## 2023-10-14 DIAGNOSIS — L84 Corns and callosities: Secondary | ICD-10-CM | POA: Diagnosis not present

## 2023-10-14 DIAGNOSIS — E1151 Type 2 diabetes mellitus with diabetic peripheral angiopathy without gangrene: Secondary | ICD-10-CM | POA: Diagnosis not present

## 2023-10-14 DIAGNOSIS — M19072 Primary osteoarthritis, left ankle and foot: Secondary | ICD-10-CM | POA: Diagnosis not present

## 2023-10-14 DIAGNOSIS — L602 Onychogryphosis: Secondary | ICD-10-CM | POA: Diagnosis not present

## 2023-10-14 DIAGNOSIS — M19071 Primary osteoarthritis, right ankle and foot: Secondary | ICD-10-CM | POA: Diagnosis not present

## 2023-10-14 DIAGNOSIS — I739 Peripheral vascular disease, unspecified: Secondary | ICD-10-CM | POA: Diagnosis not present

## 2023-10-14 DIAGNOSIS — M2061 Acquired deformities of toe(s), unspecified, right foot: Secondary | ICD-10-CM | POA: Diagnosis not present

## 2023-10-14 DIAGNOSIS — B351 Tinea unguium: Secondary | ICD-10-CM | POA: Diagnosis not present

## 2023-10-30 ENCOUNTER — Encounter

## 2023-11-07 DIAGNOSIS — L602 Onychogryphosis: Secondary | ICD-10-CM | POA: Diagnosis not present

## 2023-11-07 DIAGNOSIS — M19071 Primary osteoarthritis, right ankle and foot: Secondary | ICD-10-CM | POA: Diagnosis not present

## 2023-11-07 DIAGNOSIS — E1151 Type 2 diabetes mellitus with diabetic peripheral angiopathy without gangrene: Secondary | ICD-10-CM | POA: Diagnosis not present

## 2023-11-07 DIAGNOSIS — L84 Corns and callosities: Secondary | ICD-10-CM | POA: Diagnosis not present

## 2023-11-07 DIAGNOSIS — I739 Peripheral vascular disease, unspecified: Secondary | ICD-10-CM | POA: Diagnosis not present

## 2023-11-07 DIAGNOSIS — M2061 Acquired deformities of toe(s), unspecified, right foot: Secondary | ICD-10-CM | POA: Diagnosis not present

## 2023-11-07 DIAGNOSIS — M722 Plantar fascial fibromatosis: Secondary | ICD-10-CM | POA: Diagnosis not present

## 2023-11-07 DIAGNOSIS — L97512 Non-pressure chronic ulcer of other part of right foot with fat layer exposed: Secondary | ICD-10-CM | POA: Diagnosis not present

## 2023-11-07 DIAGNOSIS — I879 Disorder of vein, unspecified: Secondary | ICD-10-CM | POA: Diagnosis not present

## 2023-11-07 DIAGNOSIS — M19072 Primary osteoarthritis, left ankle and foot: Secondary | ICD-10-CM | POA: Diagnosis not present

## 2023-11-07 DIAGNOSIS — B351 Tinea unguium: Secondary | ICD-10-CM | POA: Diagnosis not present

## 2023-11-13 DIAGNOSIS — Z1331 Encounter for screening for depression: Secondary | ICD-10-CM | POA: Diagnosis not present

## 2023-11-13 DIAGNOSIS — E782 Mixed hyperlipidemia: Secondary | ICD-10-CM | POA: Diagnosis not present

## 2023-11-13 DIAGNOSIS — Z7901 Long term (current) use of anticoagulants: Secondary | ICD-10-CM | POA: Diagnosis not present

## 2023-11-13 DIAGNOSIS — G4700 Insomnia, unspecified: Secondary | ICD-10-CM | POA: Diagnosis not present

## 2023-11-13 DIAGNOSIS — Z950 Presence of cardiac pacemaker: Secondary | ICD-10-CM | POA: Diagnosis not present

## 2023-11-13 DIAGNOSIS — H401131 Primary open-angle glaucoma, bilateral, mild stage: Secondary | ICD-10-CM | POA: Diagnosis not present

## 2023-11-13 DIAGNOSIS — I482 Chronic atrial fibrillation, unspecified: Secondary | ICD-10-CM | POA: Diagnosis not present

## 2023-11-13 DIAGNOSIS — G4733 Obstructive sleep apnea (adult) (pediatric): Secondary | ICD-10-CM | POA: Diagnosis not present

## 2023-11-13 DIAGNOSIS — I872 Venous insufficiency (chronic) (peripheral): Secondary | ICD-10-CM | POA: Diagnosis not present

## 2023-11-13 DIAGNOSIS — I442 Atrioventricular block, complete: Secondary | ICD-10-CM | POA: Diagnosis not present

## 2023-11-13 DIAGNOSIS — Z0000 Encounter for general adult medical examination without abnormal findings: Secondary | ICD-10-CM | POA: Diagnosis not present

## 2023-11-13 DIAGNOSIS — M154 Erosive (osteo)arthritis: Secondary | ICD-10-CM | POA: Diagnosis not present

## 2023-11-13 DIAGNOSIS — E1169 Type 2 diabetes mellitus with other specified complication: Secondary | ICD-10-CM | POA: Diagnosis not present

## 2023-11-20 DIAGNOSIS — L97512 Non-pressure chronic ulcer of other part of right foot with fat layer exposed: Secondary | ICD-10-CM | POA: Diagnosis not present

## 2023-11-20 DIAGNOSIS — M2061 Acquired deformities of toe(s), unspecified, right foot: Secondary | ICD-10-CM | POA: Diagnosis not present

## 2023-11-20 DIAGNOSIS — L84 Corns and callosities: Secondary | ICD-10-CM | POA: Diagnosis not present

## 2023-11-20 DIAGNOSIS — I879 Disorder of vein, unspecified: Secondary | ICD-10-CM | POA: Diagnosis not present

## 2023-11-20 DIAGNOSIS — M722 Plantar fascial fibromatosis: Secondary | ICD-10-CM | POA: Diagnosis not present

## 2023-11-20 DIAGNOSIS — B351 Tinea unguium: Secondary | ICD-10-CM | POA: Diagnosis not present

## 2023-11-20 DIAGNOSIS — M19071 Primary osteoarthritis, right ankle and foot: Secondary | ICD-10-CM | POA: Diagnosis not present

## 2023-11-20 DIAGNOSIS — M19072 Primary osteoarthritis, left ankle and foot: Secondary | ICD-10-CM | POA: Diagnosis not present

## 2023-11-20 DIAGNOSIS — I739 Peripheral vascular disease, unspecified: Secondary | ICD-10-CM | POA: Diagnosis not present

## 2023-11-20 DIAGNOSIS — L602 Onychogryphosis: Secondary | ICD-10-CM | POA: Diagnosis not present

## 2023-11-20 DIAGNOSIS — E1151 Type 2 diabetes mellitus with diabetic peripheral angiopathy without gangrene: Secondary | ICD-10-CM | POA: Diagnosis not present

## 2023-11-26 ENCOUNTER — Other Ambulatory Visit (HOSPITAL_BASED_OUTPATIENT_CLINIC_OR_DEPARTMENT_OTHER): Payer: Self-pay

## 2023-11-26 MED ORDER — COMIRNATY 30 MCG/0.3ML IM SUSY
0.3000 mL | PREFILLED_SYRINGE | Freq: Once | INTRAMUSCULAR | 0 refills | Status: AC
  Filled 2023-11-26: qty 0.3, 1d supply, fill #0

## 2023-11-27 ENCOUNTER — Ambulatory Visit

## 2023-11-27 DIAGNOSIS — I442 Atrioventricular block, complete: Secondary | ICD-10-CM

## 2023-11-27 LAB — CUP PACEART REMOTE DEVICE CHECK
Battery Remaining Longevity: 108 mo
Battery Remaining Percentage: 100 %
Brady Statistic RV Percent Paced: 100 %
Date Time Interrogation Session: 20251009004100
Implantable Lead Connection Status: 753985
Implantable Lead Implant Date: 20150805
Implantable Lead Location: 753860
Implantable Lead Model: 4137
Implantable Lead Serial Number: 29478499
Implantable Pulse Generator Implant Date: 20250813
Lead Channel Impedance Value: 807 Ohm
Lead Channel Setting Pacing Amplitude: 3 V
Lead Channel Setting Pacing Pulse Width: 0.4 ms
Lead Channel Setting Sensing Sensitivity: 3 mV
Pulse Gen Serial Number: 943511
Zone Setting Status: 755011

## 2023-11-28 DIAGNOSIS — M19071 Primary osteoarthritis, right ankle and foot: Secondary | ICD-10-CM | POA: Diagnosis not present

## 2023-11-28 DIAGNOSIS — M19072 Primary osteoarthritis, left ankle and foot: Secondary | ICD-10-CM | POA: Diagnosis not present

## 2023-11-28 DIAGNOSIS — I879 Disorder of vein, unspecified: Secondary | ICD-10-CM | POA: Diagnosis not present

## 2023-11-28 DIAGNOSIS — B351 Tinea unguium: Secondary | ICD-10-CM | POA: Diagnosis not present

## 2023-11-28 DIAGNOSIS — E1151 Type 2 diabetes mellitus with diabetic peripheral angiopathy without gangrene: Secondary | ICD-10-CM | POA: Diagnosis not present

## 2023-11-28 DIAGNOSIS — I739 Peripheral vascular disease, unspecified: Secondary | ICD-10-CM | POA: Diagnosis not present

## 2023-11-28 DIAGNOSIS — L84 Corns and callosities: Secondary | ICD-10-CM | POA: Diagnosis not present

## 2023-11-28 DIAGNOSIS — M2061 Acquired deformities of toe(s), unspecified, right foot: Secondary | ICD-10-CM | POA: Diagnosis not present

## 2023-11-28 DIAGNOSIS — M722 Plantar fascial fibromatosis: Secondary | ICD-10-CM | POA: Diagnosis not present

## 2023-11-28 DIAGNOSIS — L97512 Non-pressure chronic ulcer of other part of right foot with fat layer exposed: Secondary | ICD-10-CM | POA: Diagnosis not present

## 2023-12-01 ENCOUNTER — Encounter

## 2023-12-02 NOTE — Progress Notes (Signed)
 Remote PPM Transmission

## 2023-12-03 ENCOUNTER — Ambulatory Visit: Payer: Self-pay | Admitting: Internal Medicine

## 2023-12-18 ENCOUNTER — Encounter: Admitting: Internal Medicine

## 2023-12-29 ENCOUNTER — Encounter: Admitting: Internal Medicine

## 2023-12-29 DIAGNOSIS — I739 Peripheral vascular disease, unspecified: Secondary | ICD-10-CM | POA: Diagnosis not present

## 2023-12-29 DIAGNOSIS — I879 Disorder of vein, unspecified: Secondary | ICD-10-CM | POA: Diagnosis not present

## 2023-12-29 DIAGNOSIS — L84 Corns and callosities: Secondary | ICD-10-CM | POA: Diagnosis not present

## 2023-12-29 DIAGNOSIS — M19071 Primary osteoarthritis, right ankle and foot: Secondary | ICD-10-CM | POA: Diagnosis not present

## 2023-12-29 DIAGNOSIS — M2061 Acquired deformities of toe(s), unspecified, right foot: Secondary | ICD-10-CM | POA: Diagnosis not present

## 2023-12-29 DIAGNOSIS — L97512 Non-pressure chronic ulcer of other part of right foot with fat layer exposed: Secondary | ICD-10-CM | POA: Diagnosis not present

## 2023-12-29 DIAGNOSIS — E1151 Type 2 diabetes mellitus with diabetic peripheral angiopathy without gangrene: Secondary | ICD-10-CM | POA: Diagnosis not present

## 2023-12-29 DIAGNOSIS — M722 Plantar fascial fibromatosis: Secondary | ICD-10-CM | POA: Diagnosis not present

## 2023-12-29 DIAGNOSIS — B351 Tinea unguium: Secondary | ICD-10-CM | POA: Diagnosis not present

## 2023-12-29 DIAGNOSIS — M19072 Primary osteoarthritis, left ankle and foot: Secondary | ICD-10-CM | POA: Diagnosis not present

## 2023-12-31 ENCOUNTER — Ambulatory Visit: Attending: Student in an Organized Health Care Education/Training Program | Admitting: Internal Medicine

## 2023-12-31 ENCOUNTER — Encounter: Payer: Self-pay | Admitting: Internal Medicine

## 2023-12-31 VITALS — BP 112/68 | HR 60 | Ht 73.0 in | Wt 198.0 lb

## 2023-12-31 DIAGNOSIS — I4821 Permanent atrial fibrillation: Secondary | ICD-10-CM | POA: Diagnosis not present

## 2023-12-31 NOTE — Progress Notes (Signed)
 HPI Patrick Lozano returns today for follow-up. He is a very pleasant 83 year old man with a history of chronic atrial fibrillation and complete heart block, status post permanent pacemaker insertion. He has done well in the interim except he feels some increased fatigue and weakness and dsypnea with exertion, especially when walking. His balance is a little bit off as well. Unchanged from last year. 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  He underwent PM gen change out about 3 months ago and has done well. He denies chest pain or sob. He has lost weight and is exercising and feels better.  Allergies  Allergen Reactions   Lisinopril      Very low blood pressure     Current Outpatient Medications  Medication Sig Dispense Refill   apixaban (ELIQUIS) 5 MG TABS tablet Take 5 mg by mouth 2 (two) times daily.     atorvastatin  (LIPITOR) 10 MG tablet Take 10 mg by mouth daily.     brimonidine (ALPHAGAN) 0.2 % ophthalmic solution Place 1 drop into both eyes in the morning and at bedtime.     cephALEXin  (KEFLEX ) 500 MG capsule Take 500 mg by mouth 3 (three) times daily. (Patient not taking: Reported on 12/31/2023)     Cholecalciferol (VITAMIN D3) 50 MCG (2000 UT) TABS Take 2,000 Units by mouth daily.     COVID-19 mRNA bivalent vaccine, Pfizer, (PFIZER COVID-19 VAC BIVALENT) injection Inject into the muscle. (Patient not taking: Reported on 12/31/2023) 0.3 mL 0   COVID-19 mRNA vaccine 2023-2024 (COMIRNATY ) SUSP injection Inject into the muscle. (Patient not taking: Reported on 12/31/2023) 0.3 mL 0   Cyanocobalamin (B-12) 5000 MCG CAPS Take 5,000 mcg by mouth 3 (three) times a week.     empagliflozin (JARDIANCE) 10 MG TABS tablet Take 10 mg by mouth daily.     fluticasone (FLONASE) 50 MCG/ACT nasal spray Place 1 spray into both nostrils daily as needed for allergies.     influenza vaccine adjuvanted (FLUAD) 0.5 ML injection Inject into the muscle. (Patient not taking: Reported on 12/31/2023) 0.5 mL  0   latanoprost (XALATAN) 0.005 % ophthalmic solution Place 1 drop into both eyes at bedtime.     metFORMIN (GLUCOPHAGE) 500 MG tablet Take 1,000 mg by mouth 2 (two) times daily with a meal.      Multiple Vitamin (MULTIVITAMIN WITH MINERALS) TABS tablet Take 1 tablet by mouth daily with supper.      mupirocin ointment (BACTROBAN) 2 % Apply 1 Application topically 2 (two) times daily. (Patient not taking: Reported on 12/31/2023)     Omega-3 Fatty Acids (FISH OIL) 1000 MG CAPS Take 2,000 mg by mouth 2 (two) times daily.      RSV vaccine recomb adjuvanted (AREXVY ) 120 MCG/0.5ML injection Inject into the muscle. (Patient not taking: Reported on 12/31/2023) 0.5 mL 0   verapamil  (CALAN -SR) 240 MG CR tablet Take 1 tablet (240 mg total) by mouth at bedtime. 90 tablet 3   No current facility-administered medications for this visit.     Past Medical History:  Diagnosis Date   Adenomatous colon polyp    Arthritis    hands, knees   Atrial fibrillation (HCC)    failed cardioversion and passed, rate control and anticoagulation    Diabetes mellitus without complication (HCC)    Dysrhythmia    Chronic Atrial Fibrillation- in and out   ED (erectile dysfunction)    does not desire treatment   Hyperlipidemia    Hypertension  Obesity, unspecified    Unspecified sleep apnea    Venous insufficiency    with lower extremity venous varicosities    ROS:   All systems reviewed and negative except as noted in the HPI.   Past Surgical History:  Procedure Laterality Date   COLONOSCOPY WITH PROPOFOL  N/A 07/03/2015   Procedure: COLONOSCOPY WITH PROPOFOL ;  Surgeon: Gladis MARLA Louder, MD;  Location: [facility] ENDOSCOPY;  Service: Endoscopy;  Laterality: N/A;   HERNIA REPAIR     inguinal   PACEMAKER INSERTION  09-22-2013   BSX single chamber pacemaker implanted by Dr Waddell for symptomatic bradycardia   PERMANENT PACEMAKER INSERTION N/A 09/22/2013   Procedure: PERMANENT PACEMAKER INSERTION;  Surgeon: Danelle LELON Waddell, MD;  Location: [facility] CATH LAB;  Service: Cardiovascular;  Laterality: N/A;   PPM GENERATOR CHANGEOUT N/A 10/01/2023   Procedure: PPM GENERATOR CHANGEOUT;  Surgeon: Waddell Danelle LELON, MD;  Location: [facility] INVASIVE CV LAB;  Service: Cardiovascular;  Laterality: N/A;   VARICOSE VEIN SURGERY Bilateral      Family History  Problem Relation Age of Onset   CAD Mother    Breast cancer Mother    Diabetes Mother    CAD Father    Prostate cancer Father    Heart failure Brother    CAD Brother      Social History   Socioeconomic History   Marital status: Married    Spouse name: Not on file   Number of children: Not on file   Years of education: Not on file   Highest education level: Not on file  Occupational History   Not on file  Tobacco Use   Smoking status: Former    Current packs/day: 0.00    Types: Cigarettes    Quit date: 06/25/1985    Years since quitting: 38.5   Smokeless tobacco: Never  Vaping Use   Vaping status: Never Used  Substance and Sexual Activity   Alcohol use: No   Drug use: No   Sexual activity: Not on file  Other Topics Concern   Not on file  Social History Narrative   Not on file   Social Drivers of Health   Financial Resource Strain: Not on file  Food Insecurity: Not on file  Transportation Needs: Not on file  Physical Activity: Not on file  Stress: Not on file  Social Connections: Not on file  Intimate Partner Violence: Not on file     BP 112/68 (BP Location: Left Arm, Patient Position: Sitting, Cuff Size: Normal)   Pulse 60   Ht 6' 1 (1.854 m)   Wt 198 lb (89.8 kg)   SpO2 98%   BMI 26.12 kg/m   Physical Exam:  Well appearing NAD HEENT: Unremarkable Neck:  No JVD, no thyromegally Lymphatics:  No adenopathy Back:  No CVA tenderness Lungs:  Clear with no wheezes HEART:  Regular rate rhythm, no murmurs, no rubs, no clicks Abd:  soft, positive bowel sounds, no organomegally, no rebound, no guarding Ext:  2 plus pulses, no edema, no  cyanosis, no clubbing Skin:  No rashes no nodules Neuro:  CN II through XII intact, motor grossly intact  DEVICE  Normal device function.  See PaceArt for details.   Assess/Plan: CHB - he is asymptomatic s/p PPM insertion. Perm atrial fib - he will continue his current meds. PPM -his new [employer] device is working normally. Histograms a little flat so we turned up his rate response.  Danelle Brynnleigh Mcelwee,MD

## 2023-12-31 NOTE — Patient Instructions (Addendum)

## 2024-01-02 DIAGNOSIS — M19071 Primary osteoarthritis, right ankle and foot: Secondary | ICD-10-CM | POA: Diagnosis not present

## 2024-01-02 DIAGNOSIS — I879 Disorder of vein, unspecified: Secondary | ICD-10-CM | POA: Diagnosis not present

## 2024-01-02 DIAGNOSIS — B351 Tinea unguium: Secondary | ICD-10-CM | POA: Diagnosis not present

## 2024-01-02 DIAGNOSIS — L97512 Non-pressure chronic ulcer of other part of right foot with fat layer exposed: Secondary | ICD-10-CM | POA: Diagnosis not present

## 2024-01-02 DIAGNOSIS — M19072 Primary osteoarthritis, left ankle and foot: Secondary | ICD-10-CM | POA: Diagnosis not present

## 2024-01-02 DIAGNOSIS — L84 Corns and callosities: Secondary | ICD-10-CM | POA: Diagnosis not present

## 2024-01-02 DIAGNOSIS — M722 Plantar fascial fibromatosis: Secondary | ICD-10-CM | POA: Diagnosis not present

## 2024-01-02 DIAGNOSIS — E1169 Type 2 diabetes mellitus with other specified complication: Secondary | ICD-10-CM | POA: Diagnosis not present

## 2024-01-02 DIAGNOSIS — M2061 Acquired deformities of toe(s), unspecified, right foot: Secondary | ICD-10-CM | POA: Diagnosis not present

## 2024-01-13 DIAGNOSIS — D225 Melanocytic nevi of trunk: Secondary | ICD-10-CM | POA: Diagnosis not present

## 2024-01-13 DIAGNOSIS — L821 Other seborrheic keratosis: Secondary | ICD-10-CM | POA: Diagnosis not present

## 2024-01-13 DIAGNOSIS — L570 Actinic keratosis: Secondary | ICD-10-CM | POA: Diagnosis not present

## 2024-01-13 DIAGNOSIS — L578 Other skin changes due to chronic exposure to nonionizing radiation: Secondary | ICD-10-CM | POA: Diagnosis not present

## 2024-01-13 DIAGNOSIS — L219 Seborrheic dermatitis, unspecified: Secondary | ICD-10-CM | POA: Diagnosis not present

## 2024-01-13 DIAGNOSIS — L814 Other melanin hyperpigmentation: Secondary | ICD-10-CM | POA: Diagnosis not present

## 2024-01-13 DIAGNOSIS — Z85828 Personal history of other malignant neoplasm of skin: Secondary | ICD-10-CM | POA: Diagnosis not present

## 2024-01-26 DIAGNOSIS — M19072 Primary osteoarthritis, left ankle and foot: Secondary | ICD-10-CM | POA: Diagnosis not present

## 2024-01-26 DIAGNOSIS — I879 Disorder of vein, unspecified: Secondary | ICD-10-CM | POA: Diagnosis not present

## 2024-01-26 DIAGNOSIS — L84 Corns and callosities: Secondary | ICD-10-CM | POA: Diagnosis not present

## 2024-01-26 DIAGNOSIS — M19071 Primary osteoarthritis, right ankle and foot: Secondary | ICD-10-CM | POA: Diagnosis not present

## 2024-01-26 DIAGNOSIS — B351 Tinea unguium: Secondary | ICD-10-CM | POA: Diagnosis not present

## 2024-01-26 DIAGNOSIS — M2061 Acquired deformities of toe(s), unspecified, right foot: Secondary | ICD-10-CM | POA: Diagnosis not present

## 2024-01-26 DIAGNOSIS — M722 Plantar fascial fibromatosis: Secondary | ICD-10-CM | POA: Diagnosis not present

## 2024-01-26 DIAGNOSIS — I739 Peripheral vascular disease, unspecified: Secondary | ICD-10-CM | POA: Diagnosis not present

## 2024-01-26 DIAGNOSIS — L97512 Non-pressure chronic ulcer of other part of right foot with fat layer exposed: Secondary | ICD-10-CM | POA: Diagnosis not present

## 2024-01-26 DIAGNOSIS — E1151 Type 2 diabetes mellitus with diabetic peripheral angiopathy without gangrene: Secondary | ICD-10-CM | POA: Diagnosis not present

## 2024-02-26 ENCOUNTER — Ambulatory Visit

## 2024-02-26 DIAGNOSIS — I442 Atrioventricular block, complete: Secondary | ICD-10-CM | POA: Diagnosis not present

## 2024-02-27 LAB — CUP PACEART REMOTE DEVICE CHECK
Battery Remaining Longevity: 108 mo
Battery Remaining Percentage: 100 %
Brady Statistic RV Percent Paced: 100 %
Date Time Interrogation Session: 20260108004100
Implantable Lead Connection Status: 753985
Implantable Lead Implant Date: 20150805
Implantable Lead Location: 753860
Implantable Lead Model: 4137
Implantable Lead Serial Number: 29478499
Implantable Pulse Generator Implant Date: 20250813
Lead Channel Impedance Value: 781 Ohm
Lead Channel Pacing Threshold Amplitude: 1.2 V
Lead Channel Pacing Threshold Pulse Width: 0.4 ms
Lead Channel Setting Pacing Amplitude: 1.8 V
Lead Channel Setting Pacing Pulse Width: 0.4 ms
Lead Channel Setting Sensing Sensitivity: 3 mV
Pulse Gen Serial Number: 943511
Zone Setting Status: 755011

## 2024-02-28 ENCOUNTER — Ambulatory Visit: Payer: Self-pay | Admitting: Cardiology

## 2024-03-02 NOTE — Progress Notes (Signed)
 Remote PPM Transmission

## 2024-05-03 ENCOUNTER — Ambulatory Visit: Admitting: Student

## 2024-05-27 ENCOUNTER — Encounter

## 2024-08-26 ENCOUNTER — Encounter

## 2024-11-25 ENCOUNTER — Encounter
